# Patient Record
Sex: Male | Born: 1975 | Race: White | Hispanic: No | Marital: Married | Smoking: Former smoker
Health system: Southern US, Community
[De-identification: ages and names within clinical notes are randomized; demographics above are authoritative.]

## PROBLEM LIST (undated history)

## (undated) DIAGNOSIS — G8929 Other chronic pain: Secondary | ICD-10-CM

## (undated) DIAGNOSIS — M7918 Myalgia, other site: Secondary | ICD-10-CM

## (undated) DIAGNOSIS — T4145XA Adverse effect of unspecified anesthetic, initial encounter: Secondary | ICD-10-CM

## (undated) DIAGNOSIS — M1990 Unspecified osteoarthritis, unspecified site: Secondary | ICD-10-CM

## (undated) DIAGNOSIS — Z9889 Other specified postprocedural states: Secondary | ICD-10-CM

## (undated) DIAGNOSIS — M549 Dorsalgia, unspecified: Secondary | ICD-10-CM

## (undated) DIAGNOSIS — R112 Nausea with vomiting, unspecified: Secondary | ICD-10-CM

## (undated) DIAGNOSIS — F419 Anxiety disorder, unspecified: Secondary | ICD-10-CM

## (undated) DIAGNOSIS — T8859XA Other complications of anesthesia, initial encounter: Secondary | ICD-10-CM

## (undated) DIAGNOSIS — I10 Essential (primary) hypertension: Secondary | ICD-10-CM

## (undated) HISTORY — DX: Dorsalgia, unspecified: M54.9

## (undated) HISTORY — DX: Other chronic pain: G89.29

## (undated) HISTORY — PX: MICRODISCECTOMY LUMBAR: SUR864

## (undated) HISTORY — DX: Myalgia, other site: M79.18

## (undated) HISTORY — PX: PILONIDAL CYST EXCISION: SHX744

## (undated) HISTORY — DX: Essential (primary) hypertension: I10

---

## 2010-01-21 ENCOUNTER — Encounter
Admission: RE | Admit: 2010-01-21 | Discharge: 2010-02-02 | Payer: Self-pay | Source: Home / Self Care | Attending: Physical Medicine & Rehabilitation | Admitting: Physical Medicine & Rehabilitation

## 2010-01-26 ENCOUNTER — Ambulatory Visit
Admission: RE | Admit: 2010-01-26 | Discharge: 2010-01-26 | Payer: Self-pay | Source: Home / Self Care | Attending: Physical Medicine & Rehabilitation | Admitting: Physical Medicine & Rehabilitation

## 2010-02-11 ENCOUNTER — Ambulatory Visit: Payer: Worker's Compensation | Admitting: Physical Medicine & Rehabilitation

## 2010-02-11 ENCOUNTER — Encounter: Payer: Worker's Compensation | Attending: Physical Medicine & Rehabilitation

## 2010-02-11 DIAGNOSIS — M5126 Other intervertebral disc displacement, lumbar region: Secondary | ICD-10-CM | POA: Insufficient documentation

## 2010-02-11 DIAGNOSIS — M5380 Other specified dorsopathies, site unspecified: Secondary | ICD-10-CM | POA: Insufficient documentation

## 2010-02-11 DIAGNOSIS — M5137 Other intervertebral disc degeneration, lumbosacral region: Secondary | ICD-10-CM

## 2010-02-11 DIAGNOSIS — M51379 Other intervertebral disc degeneration, lumbosacral region without mention of lumbar back pain or lower extremity pain: Secondary | ICD-10-CM

## 2010-02-11 DIAGNOSIS — M79609 Pain in unspecified limb: Secondary | ICD-10-CM | POA: Insufficient documentation

## 2010-02-11 DIAGNOSIS — M545 Low back pain, unspecified: Secondary | ICD-10-CM | POA: Insufficient documentation

## 2010-02-24 NOTE — Consult Note (Signed)
Reviewed records from [facility].  Reviewed records from Dr. Candise Bowens office as well as Dr. Danae Orleans. Stern's office.  Also reviewed functional capacity eval supplied by medical case manager.  CHIEF COMPLAINT:  Back pain and buttock pain.  HISTORY:  A 35 year old male who was using a sledgehammer and taking up rebar at work on May 19, 2009, when he noted sudden onset of low back pain.  This progressed over the course of days.  He sought medical attention, was seen initially at [facility], placed on naproxen and Flexeril.  He was trialed on Nucynta after he did not tolerate hydrocodone from a cognitive standpoint.  He was started on physical therapy.  He was seen by Dr. Madelon Lips from Orthopedics in June 2011.  Felt to have a radiculopathy.  Sent for MRI demonstrating annular tear, shallow disk protrusion, left L4-5 protrusion causing potential left L5 nerve root impingement.  He then was referred to Dr. Danae Orleans. Venetia Maxon, who performed a left L5 selective nerve root block on August 07, 2009, which made him "10%" better.  He was started on work conditioning, but he had an exacerbation of his pain.  An MRI September 19, 2009, showed regression of the L3-4 protrusion and stable protrusion at L4-5, mainly leftward.  He underwent an FCE on December 10, 2009 and was okay for medium heavy demand level.  He followed up with Neurosurgery and was used to work with his restrictions.  He states that his work activities have not exacerbated his pain as much as physical therapy.  I went over what they did in therapy, it was quite a bit of squatting.  The patient's current pain level is 6/10, does respond to Nucynta.  He takes a half a tablet of the 75 mg dosage up to several times a day. His pain is characterized as intermittent and stabbing including the low back and buttock area.  Pain does interfere in a mild level with his activities.  His pain is worse during evening and  nighttime hours.  Pain is worse with walking and bending, improves with rest and medication. Relief from medications is good.  He can walk 40 minutes at time.  He climbs steps.  He drives.  He works 40 hours a week.  He is in his third week of employment once again.  He has some spasm in low back area.  PAST HISTORY:  Significant for hypertension, on Benicar.  SOCIAL HISTORY:  Married, lives with wife and 3 children.  CURRENT EXERCISE PROGRAM:  Treadmill training as well as a yoga.  ALLERGIES:  CODEINE causing hives.  Opioid risk tool score of 1 based on age.  PHYSICAL EXAMINATION:  VITAL SIGNS:  Blood pressure 118/69, pulse 75, respirations 18, and O2 saturation 97% on room air. GENERAL:  A well-developed, well-nourished male in no acute distress. MUSCULOSKELETAL:  Neck full range of motion.  Upper extremity full range of motion and strength.  Normal upper extremity reflexes.  Normal upper extremity sensation. Lower extremities have negative straight leg raise test.  Negative Faber maneuver.  He has normal sensation in bilateral lower extremities.  On the right lower extremity and left side, he has decreased L4, L5, and S1 dermatomal sensory testing.  His deep tendon reflexes are normal. Strength is normal bilateral lower extremities.  Gait is normal.  He is able to toe walk and heel walk.  His lumbar spine range of motion is 50% range in forward flexion and extension.  He has more pain with forward flexion and then with extension.  IMPRESSION:  Lumbar disk, most likely symptomatic at the L4-5 level.  He does have some residual sensory deficit in left lower extremity from his radiculopathy.  1. Overall, he has done quite well with Nucynta and taking only half a     tablet at a time several times a day.  Therefore, I think it is     reasonable to trial him on tramadol so that he does not have to get     written prescriptions and be monitored for C2 substance.  He is     open to  this suggestion.  I have written him some tramadol 50 mg to     take 1-2 tablets up to 4 times per day. 2. I do not think he needs any type of pain injections at the current     time. 3. I do not think he needs any further physical therapy at this time. 4. We will follow him over time.  If he does not respond well to the     tramadol, we will resume the Nucynta. 5. Recommend continuing treadmill training as well as yoga that he is     doing at home. 6. I will see him back in about 3 weeks monitor how his return to work     is doing.  He may need an MRI in May which would be about 1 year     post see if there is any further absorption of the L4-5 disk.     [name], M.D.    AEK/MedQ D:01/26/2010 17:38:01  T:01/27/2010 03:32:01  Job #:  [id number]  Electronically Signed by Claudette Laws M.D. on 02/24/2010 09:54:18 AM Reviewed records from [facility].  Reviewed records from Dr. Candise Bowens office as well as Dr. Danae Orleans. Stern's office.  Also reviewed functional capacity eval supplied by medical case manager.  CHIEF COMPLAINT:  Back pain and buttock pain.  HISTORY:  A 35 year old male who was using a sledgehammer and taking up rebar at work on May 19, 2009, when he noted sudden onset of low back pain.  This progressed over the course of days.  He sought medical attention, was seen initially at [facility], placed on naproxen and Flexeril.  He was trialed on Nucynta after he did not tolerate hydrocodone from a cognitive standpoint.  He was started on physical therapy.  He was seen by Dr. Madelon Lips from Orthopedics in June 2011.  Felt to have a radiculopathy.  Sent for MRI demonstrating annular tear, shallow disk protrusion, left L4-5 protrusion causing potential left L5 nerve root impingement.  He then was referred to Dr. Danae Orleans. Venetia Maxon, who performed a left L5 selective nerve root block on August 07, 2009, which made him "10%" better.  He was started  on work conditioning, but he had an exacerbation of his pain.  An MRI September 19, 2009, showed regression of the L3-4 protrusion and stable protrusion at L4-5, mainly leftward.  He underwent an FCE on December 10, 2009 and was okay for medium heavy demand level.  He followed up with Neurosurgery and was used to work with his restrictions.  He states that his work activities have not exacerbated his pain as much as physical therapy.  I went over what they did in therapy, it was quite a bit of squatting.  The patient's current pain level is 6/10, does respond to Nucynta.  He  takes a half a tablet of the 75 mg dosage up to several times a day. His pain is characterized as intermittent and stabbing including the low back and buttock area.  Pain does interfere in a mild level with his activities.  His pain is worse during evening and nighttime hours.  Pain is worse with walking and bending, improves with rest and medication. Relief from medications is good.  He can walk 40 minutes at time.  He climbs steps.  He drives.  He works 40 hours a week.  He is in his third week of employment once again.  He has some spasm in low back area.  PAST HISTORY:  Significant for hypertension, on Benicar.  SOCIAL HISTORY:  Married, lives with wife and 3 children.  CURRENT EXERCISE PROGRAM:  Treadmill training as well as a yoga.  ALLERGIES:  CODEINE causing hives.  Opioid risk tool score of 1 based on age.  PHYSICAL EXAMINATION:  VITAL SIGNS:  Blood pressure 118/69, pulse 75, respirations 18, and O2 saturation 97% on room air. GENERAL:  A well-developed, well-nourished male in no acute distress. MUSCULOSKELETAL:  Neck full range of motion.  Upper extremity full range of motion and strength.  Normal upper extremity reflexes.  Normal upper extremity sensation. Lower extremities have negative straight leg raise test.  Negative Faber maneuver.  He has normal sensation in bilateral lower extremities.   On the right lower extremity and left side, he has decreased L4, L5, and S1 dermatomal sensory testing.  His deep tendon reflexes are normal. Strength is normal bilateral lower extremities.  Gait is normal.  He is able to toe walk and heel walk.  His lumbar spine range of motion is 50% range in forward flexion and extension.  He has more pain with forward flexion and then with extension.  IMPRESSION:  Lumbar disk, most likely symptomatic at the L4-5 level.  He does have some residual sensory deficit in left lower extremity from his radiculopathy.  1. Overall, he has done quite well with Nucynta and taking only half a     tablet at a time several times a day.  Therefore, I think it is     reasonable to trial him on tramadol so that he does not have to get     written prescriptions and be monitored for C2 substance.  He is     open to this suggestion.  I have written him some tramadol 50 mg to     take 1-2 tablets up to 4 times per day. 2. I do not think he needs any type of pain injections at the current     time. 3. I do not think he needs any further physical therapy at this time. 4. We will follow him over time.  If he does not respond well to the     tramadol, we will resume the Nucynta. 5. Recommend continuing treadmill training as well as yoga that he is     doing at home. 6. I will see him back in about 3 weeks monitor how his return to work     is doing.  He may need an MRI in May which would be about 1 year     post see if there is any further absorption of the L4-5 disk.     [name], M.D. Electronically Signed    AEK/MedQ D:01/26/2010 17:38:01  T:01/27/2010 03:32:01  Job #:  [id number]

## 2010-03-25 ENCOUNTER — Encounter: Payer: Worker's Compensation | Attending: Physical Medicine & Rehabilitation

## 2010-03-25 ENCOUNTER — Ambulatory Visit: Payer: Worker's Compensation | Admitting: Physical Medicine & Rehabilitation

## 2010-03-25 DIAGNOSIS — M545 Low back pain, unspecified: Secondary | ICD-10-CM | POA: Insufficient documentation

## 2010-03-25 DIAGNOSIS — M5126 Other intervertebral disc displacement, lumbar region: Secondary | ICD-10-CM | POA: Insufficient documentation

## 2010-03-25 DIAGNOSIS — M79609 Pain in unspecified limb: Secondary | ICD-10-CM | POA: Insufficient documentation

## 2010-03-25 DIAGNOSIS — M5380 Other specified dorsopathies, site unspecified: Secondary | ICD-10-CM | POA: Insufficient documentation

## 2010-03-25 DIAGNOSIS — M5137 Other intervertebral disc degeneration, lumbosacral region: Secondary | ICD-10-CM

## 2010-04-30 ENCOUNTER — Encounter: Payer: Worker's Compensation | Attending: Physical Medicine & Rehabilitation

## 2010-04-30 ENCOUNTER — Ambulatory Visit: Payer: Worker's Compensation | Admitting: Physical Medicine & Rehabilitation

## 2010-04-30 DIAGNOSIS — M5126 Other intervertebral disc displacement, lumbar region: Secondary | ICD-10-CM | POA: Insufficient documentation

## 2010-04-30 DIAGNOSIS — M5137 Other intervertebral disc degeneration, lumbosacral region: Secondary | ICD-10-CM

## 2010-04-30 DIAGNOSIS — M545 Low back pain, unspecified: Secondary | ICD-10-CM | POA: Insufficient documentation

## 2010-04-30 DIAGNOSIS — G8929 Other chronic pain: Secondary | ICD-10-CM | POA: Insufficient documentation

## 2010-04-30 DIAGNOSIS — M51379 Other intervertebral disc degeneration, lumbosacral region without mention of lumbar back pain or lower extremity pain: Secondary | ICD-10-CM | POA: Insufficient documentation

## 2010-04-30 NOTE — Assessment & Plan Note (Signed)
REASON FOR VISIT:  History of L3-4, L4-5 disk protrusions with chronic low back pain.  [name] returns today.  He is a 35 year old male with chief complaint of back pain.  He has been doing relatively well on his medication until about a week ago when he was up on a scaffold more.  He is working full-time 40 hours a week, medium heavy duty.  Max lifting 65 pounds occasional.  He has no radicular symptoms in the lower extremities.  He is using his Nucynta one half tablet about 3 times a day at this point.  His last prescription was written on April 06, 2010. He still has 25/45 left.  He is using the Celebrex 1 tablet per day.  He sometimes feels that on a bad day.  He would like to take another one. He has been on b.i.d. in the past.  He has had no other new medical problems in the interval time.  REVIEW OF SYSTEMS:  Positive for irritability.  He states that he snaps at his family a lot.  He is having some problems with the adjustment to his chronic pain.  PHYSICAL EXAMINATION:  VITAL SIGNS:  Blood pressure 119/74, pulse 79, respirations 18 and O2 sat 98% on room air. GENERAL:  Well-developed, well-nourished male, in no acute distress. Orientation x3.  Affect is alert.  Gait is normal.  EXTREMITIES: Without edema.  Mood and affect are appropriate.  He has tenderness to palpation of the lumbar paraspinal.  He has negative straight leg raise, normal strength in lower extremity and normal gait.  IMPRESSION:  Lumbar degenerative disk.  No signs of radiculopathy.  PLAN: 1. We will continue Nucynta 75 one half p.o. b.i.d. or t.i.d. 2. Celebrex 200 mg a day.  He can take an extra tablet for bad days.  I will see him back in 1 month.  Discussed a long discussion. Psychology will see the patient as well Dr. Frutoso Chase referral.     [name], M.D. Electronically Signed    AEK/MedQ D:  04/30/2010 13:25:01  T:  04/30/2010 23:16:22  Job #:  [id number]  cc:   Danae Orleans. Venetia Maxon,  M.D. Fax: [phone]

## 2010-05-27 ENCOUNTER — Ambulatory Visit: Payer: Worker's Compensation | Admitting: Physical Medicine & Rehabilitation

## 2010-05-27 ENCOUNTER — Encounter: Payer: Worker's Compensation | Attending: Physical Medicine & Rehabilitation

## 2010-05-27 DIAGNOSIS — M5126 Other intervertebral disc displacement, lumbar region: Secondary | ICD-10-CM | POA: Insufficient documentation

## 2010-05-27 DIAGNOSIS — M79609 Pain in unspecified limb: Secondary | ICD-10-CM | POA: Insufficient documentation

## 2010-05-27 DIAGNOSIS — M51379 Other intervertebral disc degeneration, lumbosacral region without mention of lumbar back pain or lower extremity pain: Secondary | ICD-10-CM

## 2010-05-27 DIAGNOSIS — M5380 Other specified dorsopathies, site unspecified: Secondary | ICD-10-CM | POA: Insufficient documentation

## 2010-05-27 DIAGNOSIS — M545 Low back pain, unspecified: Secondary | ICD-10-CM | POA: Insufficient documentation

## 2010-05-27 DIAGNOSIS — M5137 Other intervertebral disc degeneration, lumbosacral region: Secondary | ICD-10-CM

## 2010-05-28 NOTE — Assessment & Plan Note (Signed)
REASON FOR VISIT:  Back pain.  CHIEF COMPLAINT:  A 35 year old male with chief complaint of back pain doing relatively well on his medications.  He continues working 40 hours a week, medium heavy duty 65 occasional lifting, no radicular symptoms. He has had some anterior thigh pain bilaterally.  He takes his Nucynta 1/2 tablet about 3 times per day, 75 mg dose.  No signs of aberrant drug behavior.  He takes Celebrex one a day, but sometimes twice.  We did caution that he will need to have kidney function tests twice a year which he states that it can be done in his primary care physician's office.  No new medical problems.  PHYSICAL EXAMINATION:  GENERAL:  No acute distress.  Mood and affect appropriate. MUSCULOSKELETAL:  Gait is normal.  He is able to squat.  Negative straight leg raise.  Negative femoral stretch test.  Decreased sensation left L3-L4, L5-S1, dermatomal distribution.  Deep tendon reflexes are normal.  IMPRESSION:  Lumbar degenerative disk.  No signs of radiculopathy.  His sensory complaints are not changed since January.  PLAN:  We will continue his Nucynta 75 one-half p.o. b.i.d. or t.i.d., Celebrex 200 mg a day.  I will see him back in 2 months, nursing visit in 1 month.  As I discussed with patient, I think his primary physician could prescribe these medications if he is comfortable.  Meeting maximal medical improvement from a pain management standpoint.  Case manager, Waynetta Pean came in.  We discussed the above findings. She had a couple of other questions that we answered.     [name], M.D. Electronically Signed    AEK/MedQ D:  05/27/2010 14:40:07  T:  05/28/2010 01:06:12  Job #:  [id number]  cc:   Dr. Georgia Duff  [phone] [facility]

## 2010-06-23 ENCOUNTER — Ambulatory Visit: Payer: Self-pay

## 2010-06-23 ENCOUNTER — Encounter: Payer: Worker's Compensation | Attending: Physical Medicine & Rehabilitation

## 2010-06-23 DIAGNOSIS — M5137 Other intervertebral disc degeneration, lumbosacral region: Secondary | ICD-10-CM | POA: Insufficient documentation

## 2010-06-23 DIAGNOSIS — M51379 Other intervertebral disc degeneration, lumbosacral region without mention of lumbar back pain or lower extremity pain: Secondary | ICD-10-CM | POA: Insufficient documentation

## 2010-06-23 DIAGNOSIS — M549 Dorsalgia, unspecified: Secondary | ICD-10-CM | POA: Insufficient documentation

## 2010-07-26 ENCOUNTER — Ambulatory Visit: Payer: Worker's Compensation | Admitting: Physical Medicine & Rehabilitation

## 2010-07-26 ENCOUNTER — Encounter: Payer: Worker's Compensation | Attending: Physical Medicine & Rehabilitation

## 2010-07-26 DIAGNOSIS — G8929 Other chronic pain: Secondary | ICD-10-CM | POA: Insufficient documentation

## 2010-07-26 DIAGNOSIS — M5137 Other intervertebral disc degeneration, lumbosacral region: Secondary | ICD-10-CM

## 2010-07-26 DIAGNOSIS — M545 Low back pain, unspecified: Secondary | ICD-10-CM | POA: Insufficient documentation

## 2010-07-26 DIAGNOSIS — M5126 Other intervertebral disc displacement, lumbar region: Secondary | ICD-10-CM | POA: Insufficient documentation

## 2010-07-26 DIAGNOSIS — M51379 Other intervertebral disc degeneration, lumbosacral region without mention of lumbar back pain or lower extremity pain: Secondary | ICD-10-CM | POA: Insufficient documentation

## 2010-07-27 NOTE — Assessment & Plan Note (Signed)
REASON FOR VISIT:  Back pain and lower extremity pain.  A 35 year old male with chronic back pain, lumbar herniated nucleus pulposus with left lower extremity radicular discomfort.  He is working 40 hours a week, medium heavy duty, 65-pound occasional lifting, no radicular symptoms.  Last visit May 27, 2010.  He did run out of his Celebrex for couple of days.  He noticed a big difference off the Celebrex.  He in the past has tried higher doses of Nucynta i.e., 50 mg t.i.d. which causes excessive sedation and 37.5 t.i.d. seems to be a good balance for him.  PHYSICAL EXAMINATION:  GENERAL:  No acute distress. NEUROLOGIC:  Mood and affect appropriate.  Gait is normal.  Able to squat.  Negative straight leg raising test.  Deep tendon reflexes are normal in bilateral lower extremities.  IMPRESSION:  Lumbar degenerative disk, has complaints of radicular-type symptomatology, but no real physical findings that looked like a new acute disk herniation.  PLAN:  We will continue his Nucynta 75 mg one-half p.o. t.i.d. as well as Celebrex 200 mg a day on a more p.r.n. basis.  We will prescribe some Flexeril 5 mg t.i.d. p.r.n.  He is not going to be using this every day.  He has met maximal medical improvement from pain management standpoint. I will have him see the nurse in next 2 months and see me on third month and at some point we can transition to the [facility].     [name], M.D. Electronically Signed    AEK/MedQ D:  07/26/2010 14:38:25  T:  07/26/2010 23:58:01  Job #:  [id number]  cc:   Mollie Germany Workers [employer] Adjuster, [address] [place], [place] [address]  Danae Orleans. Venetia Maxon, M.D. Fax: [phone]

## 2010-07-30 ENCOUNTER — Ambulatory Visit: Payer: Self-pay | Admitting: Physical Medicine & Rehabilitation

## 2010-08-27 ENCOUNTER — Encounter: Payer: Worker's Compensation | Attending: Neurosurgery | Admitting: Neurosurgery

## 2010-08-27 DIAGNOSIS — M5430 Sciatica, unspecified side: Secondary | ICD-10-CM

## 2010-08-27 DIAGNOSIS — M5137 Other intervertebral disc degeneration, lumbosacral region: Secondary | ICD-10-CM | POA: Insufficient documentation

## 2010-08-27 DIAGNOSIS — M545 Low back pain, unspecified: Secondary | ICD-10-CM | POA: Insufficient documentation

## 2010-08-27 DIAGNOSIS — M51379 Other intervertebral disc degeneration, lumbosacral region without mention of lumbar back pain or lower extremity pain: Secondary | ICD-10-CM | POA: Insufficient documentation

## 2010-08-27 NOTE — Assessment & Plan Note (Signed)
Account [id number].  This is a patient who is seen by Dr. Wynn Banker for back pain.  He is working still.  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  He states that he has a constant low back pain that radiates into his buttocks bilaterally.  He rates his pain at 6 to 7.  It is a sharp stabbing pain that is constant.  Pain is worse in the morning and evening.  Sleep patterns are fair.  Pain is worse with walking, standing, and most activity.  Rest and medication tend to help, and he walks without assistance.  He can walk up to an hour at a time and climb steps and drive.  REVIEW OF SYSTEMS:  Notable for those difficulties, otherwise within normal limits.  No suicidal thoughts or aberrant behaviors.  Oswestry score is 32.  PAST MEDICAL HISTORY:  Unchanged.  SOCIAL HISTORY:  He is married, lives with his wife.  FAMILY HISTORY:  Unchanged.  PHYSICAL EXAMINATION:  VITAL SIGNS:  His blood pressure 130/78, pulse 85, respirations 16, O2 sats 98 on room air.  His motor strength is 5/5 in lower extremities.  Sensation is intact. Constitutionally, he is within normal limits.  He is alert and oriented x3, has a slight limp.  ASSESSMENT:  Degenerative disk disease.  Radiation of pain into the buttocks no lower extremity radiculopathy.  PLAN:  Continue Nucynta 1/2 to one up to b.i.d. 45 with no refill and continue Celebrex, Flexeril.  He has refills on those.  We will follow him up here in 1 month.  His questions were encouraged and answered. The patient does not want to pursue any kind of surgical intervention at this time.     Eric Casey Electronically Signed    RLW/MedQ D:  08/27/2010 13:34:04  T:  08/27/2010 21:37:10  Job #:  [id number]

## 2010-09-28 ENCOUNTER — Ambulatory Visit (HOSPITAL_COMMUNITY)
Admission: RE | Admit: 2010-09-28 | Discharge: 2010-09-28 | Disposition: A | Discharge status: Home / Self Care | Payer: Worker's Compensation | Source: Ambulatory Visit | Attending: Physical Medicine & Rehabilitation | Admitting: Physical Medicine & Rehabilitation

## 2010-09-28 ENCOUNTER — Other Ambulatory Visit: Payer: Self-pay | Admitting: Physical Medicine & Rehabilitation

## 2010-09-28 ENCOUNTER — Encounter: Payer: Worker's Compensation | Attending: Neurosurgery | Admitting: Neurosurgery

## 2010-09-28 DIAGNOSIS — M549 Dorsalgia, unspecified: Secondary | ICD-10-CM

## 2010-09-28 DIAGNOSIS — M545 Low back pain, unspecified: Secondary | ICD-10-CM | POA: Insufficient documentation

## 2010-09-28 DIAGNOSIS — M79609 Pain in unspecified limb: Secondary | ICD-10-CM | POA: Insufficient documentation

## 2010-09-28 DIAGNOSIS — G894 Chronic pain syndrome: Secondary | ICD-10-CM

## 2010-09-28 DIAGNOSIS — M5137 Other intervertebral disc degeneration, lumbosacral region: Secondary | ICD-10-CM | POA: Insufficient documentation

## 2010-09-28 DIAGNOSIS — I10 Essential (primary) hypertension: Secondary | ICD-10-CM | POA: Insufficient documentation

## 2010-09-28 DIAGNOSIS — M51379 Other intervertebral disc degeneration, lumbosacral region without mention of lumbar back pain or lower extremity pain: Secondary | ICD-10-CM | POA: Insufficient documentation

## 2010-09-28 NOTE — Assessment & Plan Note (Signed)
This is a patient of Dr. Wynn Banker who is seen for low back pain and left thigh radicular pain.  The patient states the pain is getting worse.  It is point constant.  His wife is with him today who states that he is becoming more irritable around the house.  He feels like that something is definitely wrong in his back.  He does not want to stay on pain medicine forever.  He rates his average pain now at an 8.  It is a sharp, burning-type pain.  General activity level is 7-8.  Pain is worse in morning and night.  Bending and sitting tend to aggravate with activity.  Rest, heat, and medication tend to help.  He walks without assistance.  He can climb steps and drive.  He still tries to work 40 hours a week in his car.  REVIEW OF SYSTEMS:  Notable for difficulties described above, otherwise within normal limits.  PAST MEDICAL HISTORY:  Significant for hypertension.  SOCIAL HISTORY:  Married.  FAMILY HISTORY:  Unchanged.  PHYSICAL EXAMINATION:  His blood pressure is 115/74, pulse 75, respirations 16, and O2 sats 96 on room air.  His motor strength is 5/5 in lower extremities with iliopsoas and quadriceps to confrontational testing.  Sensation is intact in the right lower extremity.  Left lower extremity shows some diminished sensation in the dorsum that has worsened.  He has pain with flexion and extension and is reportedly having more trouble with his gait.  He does walk somewhat with a kyphotic stance today.  ASSESSMENT:  Degenerative disk disease, chronic low back pain worsening.  PLAN:  Continue refill on Nucynta 75 mg 1 to 1-1/2 p.o. b.i.d., #45 with no refill.  He is taking Celebrex, he will continue that, but he is this going to stop for short period.  Try Sterapred 10 mg 6-day dose pack. He is instructed to stop Celebrex as of today and start the dose pack tomorrow and start the Celebrex back the day after he finishes dose pack.  We are also going to get an MRI of the lumbar  spine without gadolinium as well as a full lumbar spine series radiographs with flexion/extension views.  We will see him back in a month to go over this.  He states if things are worse he wants [place] to call him when the MRI report comes and then refer him back to Dr. Maeola Harman at [facility].  His questions were otherwise encouraged and answered.     Tawana Pasch L. Blima Dessert Electronically Signed    RLW/MedQ D:  09/28/2010 10:30:57  T:  09/28/2010 11:00:54  Job #:  [id number]

## 2010-11-01 ENCOUNTER — Encounter: Payer: Worker's Compensation | Attending: Neurosurgery

## 2010-11-01 ENCOUNTER — Ambulatory Visit: Payer: Worker's Compensation | Admitting: Physical Medicine & Rehabilitation

## 2010-11-01 DIAGNOSIS — M5137 Other intervertebral disc degeneration, lumbosacral region: Secondary | ICD-10-CM

## 2010-11-01 DIAGNOSIS — M545 Low back pain, unspecified: Secondary | ICD-10-CM | POA: Insufficient documentation

## 2010-11-01 DIAGNOSIS — I10 Essential (primary) hypertension: Secondary | ICD-10-CM | POA: Insufficient documentation

## 2010-11-01 DIAGNOSIS — M47817 Spondylosis without myelopathy or radiculopathy, lumbosacral region: Secondary | ICD-10-CM

## 2010-11-01 DIAGNOSIS — M51379 Other intervertebral disc degeneration, lumbosacral region without mention of lumbar back pain or lower extremity pain: Secondary | ICD-10-CM | POA: Insufficient documentation

## 2010-11-01 DIAGNOSIS — IMO0002 Reserved for concepts with insufficient information to code with codable children: Secondary | ICD-10-CM

## 2010-11-01 NOTE — Assessment & Plan Note (Signed)
REASON FOR VISIT:  Low back pain, left lower extremity pain.  HISTORY:  A 34 year old male involved in a workers comp injury.  Date of injury on May 14, 2009.  He has low back pain, left-sided radicular pain.  At last visit on September 28, 2010, complained of worsening of pain, seen by my nurse practitioner, Kallie Edward.  He tried a Sterapred dose pack.  In addition, MRI of the lumbar spine was ordered. This demonstrated no significant change compared to September 29, 2009, MRI.  Annular tear and central protrusion foramina and central canal patent at L3-4, at L4-5 some facet degenerative disease canal and right foramen open, mild left foraminal narrowing, unchanged.  Examination demonstrates mild reduction of sensation to pinprick, left L4 dermatomal distribution, otherwise intact.  Deep tendon reflexes are 1+ left ankle, 2+ right ankle, 2+ bilateral knees.  Lumbar range of motion is 50% forward flexion, extension, lateral rotation, and bending. He has pain both with forward as well as backward bending.  IMPRESSION:  Lumbar degenerative disk with intermittent radicular symptoms related to mild left L4-5 foraminal narrowing likely has some aggravation during certain movements.  He does continue to work 40 hours a week.  He is on low-dose narcotic analgesics, maintaining function, no signs of abuse although he did forget his medications today, cautioned that he will need to go back for his meds if he forgets them again.  He will see the nurse practitioner in 2 months and I will see him in 3 months.     [name], M.D. Electronically Signed    AEK/MedQ D:  11/01/2010 10:42:46  T:  11/01/2010 11:28:50  Job #:  [id number]  cc:   Danae Orleans. Venetia Maxon, M.D. Fax: [phone]  [employer] [phone]

## 2010-12-03 ENCOUNTER — Encounter: Payer: Worker's Compensation | Attending: Neurosurgery | Admitting: Neurosurgery

## 2010-12-03 DIAGNOSIS — M5137 Other intervertebral disc degeneration, lumbosacral region: Secondary | ICD-10-CM

## 2010-12-03 DIAGNOSIS — M545 Low back pain, unspecified: Secondary | ICD-10-CM | POA: Insufficient documentation

## 2010-12-03 DIAGNOSIS — M51379 Other intervertebral disc degeneration, lumbosacral region without mention of lumbar back pain or lower extremity pain: Secondary | ICD-10-CM | POA: Insufficient documentation

## 2010-12-04 NOTE — Assessment & Plan Note (Signed)
This is a patient of Dr. Wynn Banker seen for low back pain.  He did review his MRI, which demonstrated no significant changes that has some mild central canal stenosis at the L3-4 and 4-5.  The patient talked to Dr. Wynn Banker about possible ESIs __________ see him  next month. Average pain is 6-7, it is sharp, burning, aching pain.  General activity level is 5.  Pain is worse with bending and standing.  Rest, heat, and medication seem to help.  He walks with without assistance. He climbs steps and driving.  He walks about an hour at a time.  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  REVIEW OF SYSTEMS:  Notable for difficulties described above, otherwise within normal limits.  PAST MEDICAL HISTORY, SOCIAL HISTORY, AND FAMILY HISTORY:  Unchanged.  PHYSICAL EXAMINATION:  VITAL SIGNS:  His blood pressure is 116/79, pulse 74, respirations 16, and O2 sats 97 on room air. MUSCULOSKELETAL:  His motor strength and sensation intact. GENERAL:  Constitutionally within normal limits.  He is alert and oriented x3.  He has normal gait.  IMPRESSION:  Lumbar degenerative disk disease with radiculopathy.  PLAN:  Refill Nucynta 75 mg 1-1/2 to 1 p.o. b.i.d., 45 with no refill. His questions were encouraged and answered.  He will see Dr. Wynn Banker in a month to discuss possible ESIs.     Eric Casey L. Blima Dessert Electronically Signed    RLW/MedQ D:  12/03/2010 13:02:43  T:  12/03/2010 14:18:06  Job #:  [id number]

## 2010-12-31 ENCOUNTER — Ambulatory Visit: Payer: Self-pay | Admitting: Physical Medicine & Rehabilitation

## 2011-01-07 ENCOUNTER — Ambulatory Visit: Payer: Self-pay | Admitting: Neurosurgery

## 2011-01-14 ENCOUNTER — Encounter: Payer: Worker's Compensation | Attending: Neurosurgery

## 2011-01-14 ENCOUNTER — Ambulatory Visit: Payer: Worker's Compensation | Admitting: Physical Medicine & Rehabilitation

## 2011-01-14 DIAGNOSIS — I10 Essential (primary) hypertension: Secondary | ICD-10-CM | POA: Insufficient documentation

## 2011-01-14 DIAGNOSIS — IMO0002 Reserved for concepts with insufficient information to code with codable children: Secondary | ICD-10-CM

## 2011-01-14 DIAGNOSIS — M5137 Other intervertebral disc degeneration, lumbosacral region: Secondary | ICD-10-CM | POA: Insufficient documentation

## 2011-01-14 DIAGNOSIS — M545 Low back pain, unspecified: Secondary | ICD-10-CM | POA: Insufficient documentation

## 2011-01-14 DIAGNOSIS — M51379 Other intervertebral disc degeneration, lumbosacral region without mention of lumbar back pain or lower extremity pain: Secondary | ICD-10-CM | POA: Insufficient documentation

## 2011-01-15 NOTE — Assessment & Plan Note (Signed)
Eric Casey is a 36 year old male with lumbar degenerative disk.  He has had chronic low back pain in a work-related injury, but has been able to maintain work status on Nucynta low dose.  He is working 40 hours a week.  His Oswestry scores have been reviewed over the last year, ranging from 32 to 34 putting him in the moderate range.  The numeric rating scale is 7.  Walking tolerance is 60 minutes.  He can climb steps.  He can drive.  PHYSICAL EXAMINATION:  MUSCULOSKELETAL:  Straight leg raising test is negative.  Lower extremity strength is normal.  Deep tendon reflexes are reduced in left ankle.  Back has no tenderness to palpation of lumbar paraspinals.  He has limited range of motion about 50% forward flexion, extension, lateral rotation, and bending.  Lateral bending toward the right is more painful than to the left.  IMPRESSION:  Lumbar disk disease.  He does have chronic sciatica, some evidence of chronic S1 radiculopathy.  PLAN:  We will continue current medications.  He has not shown any signs of aberrant drug behavior.  He will continue Nucynta 75 mg one to one and half tablets b.i.d., 45 for a month.  I also asked for a replacement ice pack.  He has some type of freezable ice pack that he received from physical therapy that is now wearing out.  He uses it on daily basis and gets great relief with it.  I will see him back in about 1 month's time.     Eric Casey, M.D. Electronically Signed    AEK/MedQ D:  01/14/2011 10:57:45  T:  01/15/2011 02:34:13  Job #:  [id number]  cc:   [facility]

## 2011-02-11 ENCOUNTER — Ambulatory Visit: Payer: Worker's Compensation | Admitting: Physical Medicine & Rehabilitation

## 2011-02-11 ENCOUNTER — Encounter: Payer: Worker's Compensation | Attending: Neurosurgery

## 2011-02-11 DIAGNOSIS — M5137 Other intervertebral disc degeneration, lumbosacral region: Secondary | ICD-10-CM

## 2011-02-11 DIAGNOSIS — M545 Low back pain, unspecified: Secondary | ICD-10-CM | POA: Insufficient documentation

## 2011-02-11 DIAGNOSIS — M51379 Other intervertebral disc degeneration, lumbosacral region without mention of lumbar back pain or lower extremity pain: Secondary | ICD-10-CM | POA: Insufficient documentation

## 2011-02-11 DIAGNOSIS — I10 Essential (primary) hypertension: Secondary | ICD-10-CM | POA: Insufficient documentation

## 2011-02-15 NOTE — Assessment & Plan Note (Signed)
REASON FOR VISIT:  Back pain, degenerative disk.  Date of last visit, January 14, 2011.  A 36 year old male with lumbar degenerative disk, chronic low back pain, work-related injury, able to maintain work status on low-dose Nucynta, working 40 hours a week in [employer].  Oswestry scores have been stable over the year 34%, which is in his normal range.  Numeric pain rating score is 7 which is in his normal range.  Walking tolerance is 60 minutes which is his usual report.  He is able climb steps.  He is able to drive.  PHYSICAL EXAMINATION:  GENERAL:  No acute distress.  Mood and affect appropriate. MUSCULOSKELETAL:  Lower extremity strength is normal.  Back has no tenderness to palpation.  Limited range of motion 50% forward flexion, extension, lateral rotation, and bending.  Ambulation without toe drag or knee instability. NEUROLOGIC:  Mood and affect are appropriate.  REVIEW OF SYSTEMS:  Negative for numbness, tingling, weakness in the lower extremities.  IMPRESSION:  Lumbar degenerative disk chronic sciatica.  PLAN:  Continue Nucynta 1 to 1.5 mg of the 75 mg dose, 1-1/2 tablets per day, 45 tablets per month.  Ice pack on a p.r.n. basis.  I will see him back in 1 month's time.  He may be appropriate for nurse visit.     [name], M.D. Electronically Signed    AEK/MedQ D:  02/11/2011 10:34:11  T:  02/11/2011 21:09:46  Job #:  [id number]

## 2011-03-11 ENCOUNTER — Encounter: Payer: Self-pay | Admitting: Physical Medicine & Rehabilitation

## 2011-03-11 ENCOUNTER — Telehealth: Payer: Self-pay | Admitting: Physical Medicine & Rehabilitation

## 2011-03-11 ENCOUNTER — Encounter: Payer: Worker's Compensation | Attending: Neurosurgery

## 2011-03-11 ENCOUNTER — Telehealth: Payer: Self-pay | Admitting: *Deleted

## 2011-03-11 ENCOUNTER — Ambulatory Visit (HOSPITAL_BASED_OUTPATIENT_CLINIC_OR_DEPARTMENT_OTHER): Payer: Worker's Compensation | Admitting: Physical Medicine & Rehabilitation

## 2011-03-11 DIAGNOSIS — M545 Low back pain, unspecified: Secondary | ICD-10-CM | POA: Insufficient documentation

## 2011-03-11 DIAGNOSIS — M5430 Sciatica, unspecified side: Secondary | ICD-10-CM

## 2011-03-11 DIAGNOSIS — I10 Essential (primary) hypertension: Secondary | ICD-10-CM | POA: Insufficient documentation

## 2011-03-11 DIAGNOSIS — M5137 Other intervertebral disc degeneration, lumbosacral region: Secondary | ICD-10-CM | POA: Insufficient documentation

## 2011-03-11 DIAGNOSIS — M51379 Other intervertebral disc degeneration, lumbosacral region without mention of lumbar back pain or lower extremity pain: Secondary | ICD-10-CM | POA: Insufficient documentation

## 2011-03-11 MED ORDER — TAPENTADOL HCL 50 MG PO TABS: 75.0000 mg | ORAL_TABLET | Freq: Two times a day (BID) | ORAL | Status: DC | PRN

## 2011-03-11 MED ORDER — METHYLPREDNISOLONE 4 MG PO KIT: PACK | ORAL | Status: AC

## 2011-03-11 NOTE — Telephone Encounter (Signed)
This is okay would use new since the 75 mg #45

## 2011-03-11 NOTE — Telephone Encounter (Signed)
Nucynta Rx written as "50mg , take 1 1/2 tab bid" #45. Should be #90. OR Nucynta 75mg  1/2-1 bid #45. Patient said he would take this as a 2 week Rx but I pointed out that his insurance may not cover 2 scripts in 1 month. Asked that we replace Rx. He will come on Monday to pick up. Will this be okay?

## 2011-03-11 NOTE — Telephone Encounter (Signed)
Received Rx today for 50mg  Nucynta, should be 75mg  Nucynta.  Please advise.

## 2011-03-11 NOTE — Progress Notes (Signed)
Subjective:    Patient ID: Eric Casey, male    DOB: [birth year], 36 y.o.   MRN: [id number]  HPI A 36 year old male with lumbar degenerative disk, chronic low back pain,  work-related injury, able to maintain work status on low-dose Nucynta,  working 40 hours a week in [employer]. Oswestry scores have been  stable over the year 34%, which is in his normal range. Numeric pain  rating score is 7 which is in his normal range. Walking tolerance is 60  minutes which is his usual report. He is able climb steps. He is able  to drive. Generally pain in the lower extremities has been a left side in the side causing some tingling but more recently has involved the right lower extremity. No recent trauma. This has been gradual onset Pain Inventory Average Pain 6 Pain Right Now 8 My pain is sharp and burning  In the last 24 hours, has pain interfered with the following? General activity 6 Relation with others 4 Enjoyment of life 5 What TIME of day is your pain at its worst? evening Sleep (in general) Good  Pain is worse with: some activites Pain improves with: rest and medication Relief from Meds: 6  Mobility walk without assistance how many minutes can you walk? 60 ability to climb steps?  yes do you drive?  yes Do you have any goals in this area?  no  Function employed # of hrs/week 40 what is your job? [employer]  Neuro/Psych No problems in this area  Prior Studies Any changes since last visit?  no September 2012 x-rays  *RADIOLOGY REPORT*  Clinical Data: Low back and left leg pain  LUMBAR SPINE - COMPLETE WITH BENDING VIEWS  Comparison: MR 09/29/2009  Findings: No evidence of dynamic instability on lateral flexion  and extension radiographs. There is no evidence of lumbar spine  fracture. Alignment is normal. Intervertebral disc spaces are  maintained.  IMPRESSION:  Negative.  Original Report Authenticated By: Lysle Rubens                Physicians involved in  your care Any changes since last visit?  no   Review of Systems  Constitutional: Negative.   HENT: Negative.   Eyes: Negative.   Respiratory: Negative.   Cardiovascular: Negative.   Gastrointestinal: Negative.   Genitourinary: Negative.   Musculoskeletal: Negative.   Skin: Negative.   Neurological: Negative.   Hematological: Negative.   Psychiatric/Behavioral: Negative.        Objective:   Physical Exam  Constitutional: He is oriented to person, place, and time. He appears well-developed and well-nourished.  Eyes: Pupils are equal, round, and reactive to light.  Neurological: He is alert and oriented to person, place, and time. He has normal strength. A sensory deficit is present.  Reflex Scores:      Tricep reflexes are 2+ on the right side and 2+ on the left side.      Bicep reflexes are 2+ on the right side and 2+ on the left side.      Brachioradialis reflexes are 2+ on the right side and 2+ on the left side.      Patellar reflexes are 2+ on the right side and 2+ on the left side.      Achilles reflexes are 2+ on the right side and 2+ on the left side.      Reduced right L5 sensation Reduced left L3 and left L4 sensation  Motor strength is normal bilateral hip flexors, knee  flexors, knee extensors, ankle dorsiflexors, EHL          Assessment & Plan:  1. Right lumbar radiculitis L5 dermatomal distribution. This is a new finding. He had no discrete injury however I do feel he has some inflammation and therefore I'm ordering Medrol Dosepak. He'll return in one month and if no better or certainly if worse would repeat MRI lumbar spine. Discussed with patient and wife agree with plan.  2. Lumbar degenerative disc chronic pain managed well with current medications overall. He will need to come off his Celebrex while he is on the Medrol Dosepak.

## 2011-03-11 NOTE — Patient Instructions (Signed)
Stop Celebrex while on the Medrol Dosepak

## 2011-03-14 ENCOUNTER — Other Ambulatory Visit: Payer: Self-pay | Admitting: *Deleted

## 2011-03-14 MED ORDER — TAPENTADOL HCL 75 MG PO TABS: 1.0000 | ORAL_TABLET | Freq: Two times a day (BID) | ORAL | Status: DC

## 2011-04-06 ENCOUNTER — Encounter: Payer: Self-pay | Admitting: Physical Medicine & Rehabilitation

## 2011-04-15 ENCOUNTER — Encounter: Payer: Self-pay | Admitting: Physical Medicine & Rehabilitation

## 2011-04-15 ENCOUNTER — Encounter: Payer: Worker's Compensation | Attending: Neurosurgery

## 2011-04-15 ENCOUNTER — Ambulatory Visit (HOSPITAL_BASED_OUTPATIENT_CLINIC_OR_DEPARTMENT_OTHER): Payer: Worker's Compensation | Admitting: Physical Medicine & Rehabilitation

## 2011-04-15 VITALS — BP 146/78 | HR 102 | Resp 18 | Ht 69.0 in | Wt 235.0 lb

## 2011-04-15 DIAGNOSIS — M51379 Other intervertebral disc degeneration, lumbosacral region without mention of lumbar back pain or lower extremity pain: Secondary | ICD-10-CM | POA: Insufficient documentation

## 2011-04-15 DIAGNOSIS — I10 Essential (primary) hypertension: Secondary | ICD-10-CM | POA: Insufficient documentation

## 2011-04-15 DIAGNOSIS — M545 Low back pain, unspecified: Secondary | ICD-10-CM | POA: Insufficient documentation

## 2011-04-15 DIAGNOSIS — M5430 Sciatica, unspecified side: Secondary | ICD-10-CM

## 2011-04-15 DIAGNOSIS — M5137 Other intervertebral disc degeneration, lumbosacral region: Secondary | ICD-10-CM

## 2011-04-15 MED ORDER — TAPENTADOL HCL 50 MG PO TABS: 50.0000 mg | ORAL_TABLET | Freq: Three times a day (TID) | ORAL | Status: DC

## 2011-04-15 NOTE — Patient Instructions (Addendum)
Change pain medicine Consider acupuncture Consider voc rehab

## 2011-04-15 NOTE — Progress Notes (Signed)
  Subjective:    Patient ID: Eric Casey, male    DOB: 25-Nov-1975, 36 y.o.   MRN: [id number] Medrol dose pack helped back pain until it was completed. Felt nauseated, ill while he was taking A 36 year old male with lumbar degenerative disk, chronic low back pain,  work-related injury, able to maintain work status on low-dose Nucynta,  working 40 hours a week in [employer]. Oswestry scores have been  stable over the year 34%, which is in his normal range. Numeric pain  rating score is 7 which is in his normal range. Walking tolerance is 60  minutes which is his usual report. He is able climb steps. He is able  to drive.  Generally pain in the lower extremities has been a left side in the side causing some tingling but more   recently has involved the right lower extremity. No recent trauma. This has been gradual onset  Pain has been more bothersome over the last couple months. No change in work activities. We discussed medications as well as schedule.  Pain Inventory Average Pain 7 Pain Right Now 8 My pain is intermittent, sharp and stabbing  In the last 24 hours, has pain interfered with the following? General activity 8 Relation with others 8 Enjoyment of life 8 What TIME of day is your pain at its worst? during the day and in the evening Sleep (in general) Fair  Pain is worse with: walking, bending and some activites Pain improves with: rest, heat/ice and medication Relief from Meds: 5  Mobility walk without assistance how many minutes can you walk? 60 ability to climb steps?  yes do you drive?  yes Do you have any goals in this area?  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  no  Physicians involved in your care Any changes since last visit?  no      .HPI    Review of Systems  Constitutional: Negative.   HENT: Negative.   Eyes: Negative.     Respiratory: Negative.   Cardiovascular: Negative.   Gastrointestinal:       Back pain w/ straining w/ stool  Genitourinary: Positive for dysuria (pain in back w/ urination).  Musculoskeletal: Positive for back pain.  Skin: Negative.   Neurological: Negative.   Hematological: Negative.   Psychiatric/Behavioral: Negative.        Objective:   Physical Exam  Constitutional: He is oriented to person, place, and time.  Musculoskeletal:       Lumbar back: He exhibits decreased range of motion. He exhibits no tenderness, no swelling and no deformity.       Straight leg raising test is negative  Neurological: He is alert and oriented to person, place, and time. He has normal strength. No sensory deficit.          Assessment & Plan:  1. Lumbar degenerative disc with chronic low back pain. The patient is on a low dose of pain medicine and will increase it this month and reassess next month. I do not think any further MRIs are needed at this point unless symptoms change

## 2011-05-13 ENCOUNTER — Encounter: Payer: Self-pay | Admitting: Physical Medicine & Rehabilitation

## 2011-05-13 ENCOUNTER — Ambulatory Visit (HOSPITAL_BASED_OUTPATIENT_CLINIC_OR_DEPARTMENT_OTHER): Payer: Worker's Compensation | Admitting: Physical Medicine & Rehabilitation

## 2011-05-13 ENCOUNTER — Encounter: Payer: Worker's Compensation | Attending: Neurosurgery

## 2011-05-13 VITALS — BP 136/78 | HR 81 | Resp 16 | Ht 69.0 in | Wt 237.0 lb

## 2011-05-13 DIAGNOSIS — M545 Low back pain, unspecified: Secondary | ICD-10-CM | POA: Insufficient documentation

## 2011-05-13 DIAGNOSIS — M5137 Other intervertebral disc degeneration, lumbosacral region: Secondary | ICD-10-CM

## 2011-05-13 DIAGNOSIS — M51379 Other intervertebral disc degeneration, lumbosacral region without mention of lumbar back pain or lower extremity pain: Secondary | ICD-10-CM | POA: Insufficient documentation

## 2011-05-13 DIAGNOSIS — M5430 Sciatica, unspecified side: Secondary | ICD-10-CM

## 2011-05-13 DIAGNOSIS — I10 Essential (primary) hypertension: Secondary | ICD-10-CM | POA: Insufficient documentation

## 2011-05-13 MED ORDER — TAPENTADOL HCL 50 MG PO TABS: 50.0000 mg | ORAL_TABLET | Freq: Four times a day (QID) | ORAL | Status: DC | PRN

## 2011-05-13 MED ORDER — METHYLPREDNISOLONE 4 MG PO KIT: PACK | ORAL | Status: AC

## 2011-05-13 NOTE — Progress Notes (Signed)
  Subjective:    Patient ID: Eric Casey, male    DOB: 11/26/1975, 36 y.o.   MRN: [id number]  HPI 3 weeks ago had flare up of pain while squatting. He was able can any of his work with increased pain. However starting last week he had pain that prohibited him from working. Pain is also radiating down to both knees and sometimes to the left ankle. No loss of bowel or bladder function. On this and tingling worse on the left side all the way down to the ankle. Also on the right side to the knee.  Pain Inventory Average Pain 7 Pain Right Now 9 My pain is constant, sharp and stabbing  In the last 24 hours, has pain interfered with the following? General activity 9 Relation with others 8 Enjoyment of life 8 What TIME of day is your pain at its worst? daytime and evening Sleep (in general) Fair  Pain is worse with: walking and standing Pain improves with: heat/ice and medication Relief from Meds: 6  Mobility walk without assistance how many minutes can you walk? 60 ability to climb steps?  yes do you drive?  yes Do you have any goals in this area?  no  Function employed # of hrs/week 40  Neuro/Psych numbness tingling  Prior Studies Any changes since last visit?  no  Physicians involved in your care Any changes since last visit?  no      Review of Systems  Neurological: Positive for numbness.  All other systems reviewed and are negative.       Objective:   Physical Exam  Musculoskeletal:       Lumbar back: He exhibits decreased range of motion and tenderness. He exhibits no swelling, no edema and no deformity.       Reduced lumbar extension greater than lumbar flexion. Tenderness at L5-S1 junction in the paraspinal muscles  Neurological: He has normal strength.  Reflex Scores:      Tricep reflexes are 2+ on the right side and 2+ on the left side.      Bicep reflexes are 2+ on the right side and 2+ on the left side.      Brachioradialis reflexes are 2+ on  the right side and 2+ on the left side.      Patellar reflexes are 2+ on the right side and 2+ on the left side.      Achilles reflexes are 2+ on the right side and 2+ on the left side.      Diminished L5 and S1 on left diminished L4 on the right diminished L3 on left.       Assessment & Plan:  1. Degenerative disc disease with sciatica. He appears to have had an exacerbation of his complaints. I agree with neurosurgery reevaluation and likely will need MRI as well. His exam has no significant changes other than perhaps some more sensory abnormalities on the left side. We'll keep mod work until neurosurgery reevaluation. I'll see him back in one month We'll give her Medrol Dosepak New since the increased 4 times per day

## 2011-05-13 NOTE — Patient Instructions (Signed)
Will take you out of work until you are reevaluated by Dr. Venetia Maxon from neurosurgery. You have an appointment with him on Wednesday. He can decide whether you need a repeat MRI. I would recommend a repeat MRI.

## 2011-06-06 ENCOUNTER — Ambulatory Visit: Payer: Worker's Compensation | Admitting: Physical Medicine & Rehabilitation

## 2011-06-07 ENCOUNTER — Encounter: Payer: Worker's Compensation | Attending: Neurosurgery

## 2011-06-07 ENCOUNTER — Encounter: Payer: Self-pay | Admitting: Physical Medicine & Rehabilitation

## 2011-06-07 ENCOUNTER — Ambulatory Visit (HOSPITAL_BASED_OUTPATIENT_CLINIC_OR_DEPARTMENT_OTHER): Payer: Worker's Compensation | Admitting: Physical Medicine & Rehabilitation

## 2011-06-07 VITALS — BP 138/70 | HR 71 | Resp 16 | Ht 69.0 in | Wt 238.2 lb

## 2011-06-07 DIAGNOSIS — M545 Low back pain, unspecified: Secondary | ICD-10-CM | POA: Insufficient documentation

## 2011-06-07 DIAGNOSIS — I10 Essential (primary) hypertension: Secondary | ICD-10-CM | POA: Insufficient documentation

## 2011-06-07 DIAGNOSIS — M51379 Other intervertebral disc degeneration, lumbosacral region without mention of lumbar back pain or lower extremity pain: Secondary | ICD-10-CM | POA: Insufficient documentation

## 2011-06-07 DIAGNOSIS — M5137 Other intervertebral disc degeneration, lumbosacral region: Secondary | ICD-10-CM | POA: Insufficient documentation

## 2011-06-07 MED ORDER — DICLOFENAC EPOLAMINE 1.3 % TD PTCH: 1.0000 | MEDICATED_PATCH | Freq: Two times a day (BID) | TRANSDERMAL | Status: DC

## 2011-06-07 MED ORDER — TAPENTADOL HCL 50 MG PO TABS: 50.0000 mg | ORAL_TABLET | Freq: Four times a day (QID) | ORAL | Status: DC | PRN

## 2011-06-07 NOTE — Patient Instructions (Addendum)
Continue with medications, Continue with walking program, start swimming, especially back stroke. Also do core exercises I showed you.

## 2011-06-07 NOTE — Progress Notes (Signed)
Subjective:    Patient ID: Eric Casey, male    DOB: 12-21-1975, 36 y.o.   MRN: [id number]  HPI The patient complains about chronic low back  pain which radiates into his LE bilateral.  The problem has been stable. The patient saw Dr. Larina Bras since the last visit reported with MRI. The MRI showed some disc bulging at L3-4 and L4- 5, worse at L4-5. The patient states, that  Dr. Venetia Maxon stated, that the buldging of the disc has increased some, but he also stated, that the patient is not a surgical candidate at that time.   Pain Inventory Average Pain 7 Pain Right Now 6 My pain is constant and aching  In the last 24 hours, has pain interfered with the following? General activity 6 Relation with others 6 Enjoyment of life 7 What TIME of day is your pain at its worst? evening Sleep (in general) Fair  Pain is worse with: walking and some activites Pain improves with: rest, heat/ice and medication Relief from Meds: 6  Mobility walk without assistance how many minutes can you walk? 60 ability to climb steps?  yes do you drive?  yes  Function disabled: date disabled 05/04/11  Neuro/Psych numbness anxiety  Prior Studies Any changes since last visit?  no  Physicians involved in your care Any changes since last visit?  no    Family History  Problem Relation Age of Onset  . Heart disease Father   . Hypertension Father    History   Social History  . Marital Status: Married    Spouse Name: N/A    Number of Children: N/A  . Years of Education: N/A   Social History Main Topics  . Smoking status: Former Smoker    Types: Cigarettes    Quit date: 04/15/2006  . Smokeless tobacco: Never Used  . Alcohol Use: No  . Drug Use: No  . Sexually Active: None   Other Topics Concern  . None   Social History Narrative  . None   History reviewed. No pertinent past surgical history. Past Medical History  Diagnosis Date  . High blood pressure   . Back pain, chronic   .  Buttock pain    BP 138/70  Pulse 71  Resp 16  Ht 5\' 9"  (1.753 m)  Wt 238 lb 3.2 oz (108.047 kg)  BMI 35.18 kg/m2  SpO2 98%   Review of Systems  Musculoskeletal: Positive for back pain.  Neurological: Positive for numbness.  Psychiatric/Behavioral: The patient is nervous/anxious.   All other systems reviewed and are negative.       Objective:   Physical Exam  Constitutional: He is oriented to person, place, and time. He appears well-developed and well-nourished.  HENT:  Head: Normocephalic.  Eyes: Pupils are equal, round, and reactive to light.  Neck: Normal range of motion.  Neurological: He is alert and oriented to person, place, and time.  Skin: Skin is warm and dry.  Psychiatric: He has a normal mood and affect.    Symmetric normal motor tone is noted throughout. Normal muscle bulk. Muscle testing reveals 5/5 muscle strength of the upper extremity, and 5/5 of the lower extremity. Full range of motion in upper and lower extremities. ROM of spine is  restricted. Fine motor movements are normal in both hands. Sensory is intact and symmetric to light touch, pinprick and proprioception. DTR in the upper and lower extremity are present and symmetric 2+. No clonus is noted.  Patient arises from chair without  difficulty. Narrow based gait with normal arm swing bilateral , able to walk on heels and toes . Tandem walk is stable. No pronator drift.    Tenderness at level of L4 bilateral.      Assessment & Plan:  This is a 36 year old  male with 1.DDD in L-spine 2.Sciatica 3.Buldging discs, L3-4 and L4-5, worse at L4-5 4.LBP, radiating into LE bilateral Plan : Patient should continue with his walking program I also showed him some core strengthening exercises which he should do regular rate, he also should start a swimming program especially the back stroke would be very beneficial for this problem. He should continue with this medication, I also will prescribed him Flexeril  patches for exacerbation. If all of these treatments will not improve his problem we might consider injections. The patient was taking out of work by Dr. Venetia Maxon  and he should stay out of work. Nurse case manager Velna Hatchet Ward was present at the visit, and agreed with the plan. Follow up in 1 month.

## 2011-06-13 ENCOUNTER — Ambulatory Visit: Payer: Worker's Compensation | Admitting: Physical Medicine & Rehabilitation

## 2011-07-06 ENCOUNTER — Encounter
Payer: Worker's Compensation | Attending: Physical Medicine & Rehabilitation | Admitting: Physical Medicine and Rehabilitation

## 2011-07-06 ENCOUNTER — Encounter: Payer: Self-pay | Admitting: Physical Medicine and Rehabilitation

## 2011-07-06 VITALS — BP 124/71 | HR 89 | Resp 16 | Ht 69.0 in | Wt 237.0 lb

## 2011-07-06 DIAGNOSIS — M545 Low back pain, unspecified: Secondary | ICD-10-CM

## 2011-07-06 DIAGNOSIS — M5430 Sciatica, unspecified side: Secondary | ICD-10-CM | POA: Insufficient documentation

## 2011-07-06 DIAGNOSIS — M5137 Other intervertebral disc degeneration, lumbosacral region: Secondary | ICD-10-CM | POA: Insufficient documentation

## 2011-07-06 DIAGNOSIS — G8929 Other chronic pain: Secondary | ICD-10-CM | POA: Insufficient documentation

## 2011-07-06 DIAGNOSIS — M51379 Other intervertebral disc degeneration, lumbosacral region without mention of lumbar back pain or lower extremity pain: Secondary | ICD-10-CM | POA: Insufficient documentation

## 2011-07-06 DIAGNOSIS — M79609 Pain in unspecified limb: Secondary | ICD-10-CM | POA: Insufficient documentation

## 2011-07-06 MED ORDER — TAPENTADOL HCL 50 MG PO TABS: 50.0000 mg | ORAL_TABLET | Freq: Four times a day (QID) | ORAL | Status: DC | PRN

## 2011-07-06 NOTE — Progress Notes (Signed)
Subjective:    Patient ID: Eric Casey, male    DOB: 1975/07/07, 36 y.o.   MRN: [id number]  HPI The patient complains about chronic low back pain which radiates into his LE bilateral.  The problem has been stable. The patient saw Dr. Venetia Maxon since the last visit reported with MRI. The MRI showed some disc bulging at L3-4 and L4- 5, worse at L4-5. The patient states, that Dr. Venetia Maxon stated, that the buldging of the disc has increased some, but he also stated, that the patient is not a surgical candidate at that time.  Pain Inventory Average Pain 7 Pain Right Now 7 My pain is sharp, stabbing and aching  In the last 24 hours, has pain interfered with the following? General activity 4 Relation with others 4 Enjoyment of life 5 What TIME of day is your pain at its worst? Daytime and Evening Sleep (in general) Fair  Pain is worse with: walking and some activites Pain improves with: rest, heat/ice and medication Relief from Meds: 6  Mobility walk without assistance how many minutes can you walk? 60 ability to climb steps?  yes do you drive?  yes  Function disabled: date disabled 5/13  Neuro/Psych numbness anxiety  Prior Studies Any changes since last visit?  no  Physicians involved in your care Any changes since last visit?  no   Family History  Problem Relation Age of Onset  . Heart disease Father   . Hypertension Father    History   Social History  . Marital Status: Married    Spouse Name: N/A    Number of Children: N/A  . Years of Education: N/A   Social History Main Topics  . Smoking status: Former Smoker    Types: Cigarettes    Quit date: 04/15/2006  . Smokeless tobacco: Never Used  . Alcohol Use: No  . Drug Use: No  . Sexually Active: None   Other Topics Concern  . None   Social History Narrative  . None   History reviewed. No pertinent past surgical history. Past Medical History  Diagnosis Date  . High blood pressure   . Back pain,  chronic   . Buttock pain    BP 124/71  Pulse 89  Resp 16  Ht 5\' 9"  (1.753 m)  Wt 237 lb (107.502 kg)  BMI 35.00 kg/m2  SpO2 94%      Review of Systems  Constitutional: Negative.   HENT: Negative.   Eyes: Negative.   Respiratory: Negative.   Cardiovascular: Negative.   Gastrointestinal: Negative.   Genitourinary: Negative.   Musculoskeletal: Positive for back pain.  Skin: Negative.   Neurological: Positive for numbness.  Hematological: Negative.   Psychiatric/Behavioral: Negative.        Objective:   Physical Exam Constitutional: He is oriented to person, place, and time. He appears well-developed and well-nourished.  HENT:  Head: Normocephalic.  Eyes: Pupils are equal, round, and reactive to light.  Neck: Normal range of motion.  Neurological: He is alert and oriented to person, place, and time.  Skin: Skin is warm and dry.  Psychiatric: He has a normal mood and affect.   Symmetric normal motor tone is noted throughout. Normal muscle bulk. Muscle testing reveals 5/5 muscle strength of the upper extremity, and 5/5 of the lower extremity. Full range of motion in upper and lower extremities. ROM of spine is restricted. Fine motor movements are normal in both hands.  Sensory is intact and symmetric to light touch, pinprick and  proprioception.  DTR in the upper and lower extremity are present and symmetric 2+. No clonus is noted.  Patient arises from chair without difficulty. Narrow based gait with normal arm swing bilateral , able to walk on heels and toes . Tandem walk is stable. No pronator drift.  Tenderness at level of L4 bilateral.        Assessment & Plan:  This is a 36 year old male with  1.DDD in L-spine  2.Sciatica  3.Buldging discs, L3-4 and L4-5, worse at L4-5  4.LBP, radiating into LE bilateral  Plan :  Patient should continue with his walking program I also showed him some core strengthening exercises which he should do regular rate, he also should  start a swimming program especially the back stroke would be very beneficial for this problem. He should continue with this medication.  If all of these treatments will not improve his problem we might consider injections. The patient was taking out of work by Dr. Venetia Maxon and he should stay out of work. Recommended to the patient to take a natural supplement , rhodiola  recharge full his irritability, anxiety and lack of energy. Might consider valium, if this supplements does not give him relief.  Follow up in 1 month.

## 2011-07-06 NOTE — Patient Instructions (Signed)
Continue with exercising, continue with walking program. Exercise in your pool.

## 2011-07-06 NOTE — Addendum Note (Signed)
Addended by: Judd Gaudier on: 07/06/2011 01:48 PM   Modules accepted: Orders

## 2011-08-03 ENCOUNTER — Ambulatory Visit: Payer: Self-pay | Admitting: Physical Medicine and Rehabilitation

## 2011-08-08 ENCOUNTER — Encounter
Payer: Worker's Compensation | Attending: Physical Medicine and Rehabilitation | Admitting: Physical Medicine and Rehabilitation

## 2011-08-08 ENCOUNTER — Encounter: Payer: Self-pay | Admitting: Physical Medicine and Rehabilitation

## 2011-08-08 VITALS — BP 114/66 | HR 93 | Resp 14 | Ht 70.0 in | Wt 233.0 lb

## 2011-08-08 DIAGNOSIS — M5430 Sciatica, unspecified side: Secondary | ICD-10-CM | POA: Insufficient documentation

## 2011-08-08 DIAGNOSIS — R454 Irritability and anger: Secondary | ICD-10-CM

## 2011-08-08 DIAGNOSIS — M5137 Other intervertebral disc degeneration, lumbosacral region: Secondary | ICD-10-CM | POA: Insufficient documentation

## 2011-08-08 DIAGNOSIS — M51379 Other intervertebral disc degeneration, lumbosacral region without mention of lumbar back pain or lower extremity pain: Secondary | ICD-10-CM | POA: Insufficient documentation

## 2011-08-08 DIAGNOSIS — G8929 Other chronic pain: Secondary | ICD-10-CM | POA: Insufficient documentation

## 2011-08-08 DIAGNOSIS — M545 Low back pain, unspecified: Secondary | ICD-10-CM | POA: Insufficient documentation

## 2011-08-08 MED ORDER — TAPENTADOL HCL 50 MG PO TABS: 50.0000 mg | ORAL_TABLET | Freq: Four times a day (QID) | ORAL | Status: DC | PRN

## 2011-08-08 NOTE — Patient Instructions (Signed)
Continue with walking program, continue with exercising, try to work out in the pool.

## 2011-08-08 NOTE — Progress Notes (Signed)
Subjective:    Patient ID: Eric Casey, male    DOB: [birth year], 36 y.o.   MRN: [id number]  HPI The patient complains about chronic low back pain which radiates into his LE bilateral.  The problem has been stable. The patient saw Dr. Venetia Maxon since the last visit reported with MRI. The MRI showed some disc bulging at L3-4 and L4- 5, worse at L4-5. The patient states, that Dr. Venetia Maxon stated, that the buldging of the disc has increased some, but he also stated, that the patient is not a surgical candidate at that time.  Pain Inventory Average Pain 6 Pain Right Now 7 My pain is stabbing and aching  In the last 24 hours, has pain interfered with the following? General activity 6 Relation with others 7 Enjoyment of life 6 What TIME of day is your pain at its worst? day and night Sleep (in general) Fair  Pain is worse with: walking and some activites Pain improves with: rest, heat/ice and medication Relief from Meds: 5  Mobility walk without assistance how many minutes can you walk? 60 ability to climb steps?  yes do you drive?  yes Do you have any goals in this area?  no  Function disabled: date disabled 5/1 Do you have any goals in this area?  no  Neuro/Psych anxiety  Prior Studies Any changes since last visit?  no  Physicians involved in your care Any changes since last visit?  no   Family History  Problem Relation Age of Onset  . Heart disease Father   . Hypertension Father    History   Social History  . Marital Status: Married    Spouse Name: N/A    Number of Children: N/A  . Years of Education: N/A   Social History Main Topics  . Smoking status: Former Smoker    Types: Cigarettes    Quit date: 04/15/2006  . Smokeless tobacco: Never Used  . Alcohol Use: No  . Drug Use: No  . Sexually Active: None   Other Topics Concern  . None   Social History Narrative  . None   History reviewed. No pertinent past surgical history. Past Medical History    Diagnosis Date  . High blood pressure   . Back pain, chronic   . Buttock pain    BP 114/66  Pulse 93  Resp 14  Ht 5\' 10"  (1.778 m)  Wt 233 lb (105.688 kg)  BMI 33.43 kg/m2  SpO2 98%    Review of Systems  Musculoskeletal: Positive for back pain.  Psychiatric/Behavioral: The patient is nervous/anxious.   All other systems reviewed and are negative.       Objective:   Physical Exam Constitutional: He is oriented to person, place, and time. He appears well-developed and well-nourished.  HENT:  Head: Normocephalic.  Eyes: Pupils are equal, round, and reactive to light.  Neck: Normal range of motion.  Neurological: He is alert and oriented to person, place, and time.  Skin: Skin is warm and dry.  Psychiatric: He has a normal mood and affect.  Symmetric normal motor tone is noted throughout. Normal muscle bulk. Muscle testing reveals 5/5 muscle strength of the upper extremity, and 5/5 of the lower extremity. Full range of motion in upper and lower extremities. ROM of spine is restricted. Fine motor movements are normal in both hands.  Sensory is intact and symmetric to light touch, pinprick and proprioception.  DTR in the upper and lower extremity are present and symmetric 2+.  No clonus is noted.  Patient arises from chair without difficulty. Narrow based gait with normal arm swing bilateral , able to walk on heels and toes . Tandem walk is stable. No pronator drift.  Tenderness at level of L4 bilateral.        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 He should continue with this medication. If all of  these treatments will not improve his problem we might consider injections. The patient was taking out of work by Dr. Venetia Maxon and he should stay out of work. Ordered a referral to psychiatry for the patient's irritability and anxiety. and lack of energy.  Follow up in 1 month.

## 2011-08-12 ENCOUNTER — Telehealth: Payer: Self-pay | Admitting: *Deleted

## 2011-08-12 NOTE — Telephone Encounter (Signed)
All the pt needs is for [place] to call the pharmacy to have this filled early since he is leaving for out of town. I will call the pharmacy to give them the okay to fill this for him.

## 2011-08-12 NOTE — Telephone Encounter (Signed)
Refill Nucynta. °

## 2011-09-07 ENCOUNTER — Other Ambulatory Visit: Payer: Self-pay | Admitting: *Deleted

## 2011-09-08 ENCOUNTER — Encounter: Payer: Self-pay | Admitting: Physical Medicine & Rehabilitation

## 2011-09-08 ENCOUNTER — Encounter: Payer: Worker's Compensation | Attending: Neurosurgery

## 2011-09-08 ENCOUNTER — Ambulatory Visit (HOSPITAL_BASED_OUTPATIENT_CLINIC_OR_DEPARTMENT_OTHER): Payer: Worker's Compensation | Admitting: Physical Medicine & Rehabilitation

## 2011-09-08 ENCOUNTER — Encounter: Payer: Worker's Compensation | Admitting: Physical Medicine and Rehabilitation

## 2011-09-08 VITALS — BP 132/86 | HR 82 | Resp 16 | Ht 70.0 in | Wt 232.0 lb

## 2011-09-08 DIAGNOSIS — M545 Low back pain, unspecified: Secondary | ICD-10-CM | POA: Insufficient documentation

## 2011-09-08 DIAGNOSIS — M5430 Sciatica, unspecified side: Secondary | ICD-10-CM

## 2011-09-08 DIAGNOSIS — M5137 Other intervertebral disc degeneration, lumbosacral region: Secondary | ICD-10-CM

## 2011-09-08 DIAGNOSIS — M51379 Other intervertebral disc degeneration, lumbosacral region without mention of lumbar back pain or lower extremity pain: Secondary | ICD-10-CM | POA: Insufficient documentation

## 2011-09-08 DIAGNOSIS — I10 Essential (primary) hypertension: Secondary | ICD-10-CM | POA: Insufficient documentation

## 2011-09-08 MED ORDER — TAPENTADOL HCL ER 100 MG PO TB12: 100.0000 mg | ORAL_TABLET | Freq: Two times a day (BID) | ORAL | Status: DC

## 2011-09-08 NOTE — Patient Instructions (Signed)
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

## 2011-09-08 NOTE — Progress Notes (Signed)
Subjective:    Patient ID: Eric Casey, male    DOB: 10-19-1975, 36 y.o.   MRN: [id number]  HPI Out of work per neurosurgery, getting GED Has seen second opinion PMNR physician, I reviewed her recommendations with the patient as well as the case manager at length. We went through the pros and cons of each of the various treatment alternatives. We discussed his mood and the fact that his current short acting pain medication may be causing some ups and downs He is doing walking on a regular basis. Tried to lose weight.  Pain Inventory Average Pain 7 Pain Right Now 7 My pain is sharp, stabbing and aching  In the last 24 hours, has pain interfered with the following? General activity 5 Relation with others 6 Enjoyment of life 6 What TIME of day is your pain at its worst? daytime Sleep (in general) Fair  Pain is worse with: walking, standing and some activites Pain improves with: rest, heat/ice and medication Relief from Meds: 4  Mobility how many minutes can you walk? 60 ability to climb steps?  yes do you drive?  yes  Function not employed: date last employed 05/04/11  Neuro/Psych No problems in this area  Prior Studies Any changes since last visit?  no  Physicians involved in your care Any changes since last visit?  no   Family History  Problem Relation Age of Onset  . Heart disease Father   . Hypertension Father    History   Social History  . Marital Status: Married    Spouse Name: N/A    Number of Children: N/A  . Years of Education: N/A   Social History Main Topics  . Smoking status: Former Smoker    Types: Cigarettes    Quit date: 04/15/2006  . Smokeless tobacco: Never Used  . Alcohol Use: No  . Drug Use: No  . Sexually Active: None   Other Topics Concern  . None   Social History Narrative  . None   History reviewed. No pertinent past surgical history. Past Medical History  Diagnosis Date  . High blood pressure   . Back pain,  chronic   . Buttock pain    BP 132/86  Pulse 82  Resp 16  Ht 5\' 10"  (1.778 m)  Wt 232 lb (105.235 kg)  BMI 33.29 kg/m2  SpO2 98%      Review of Systems  Constitutional: Negative.   HENT: Negative.   Eyes: Negative.   Respiratory: Negative.   Cardiovascular: Negative.   Gastrointestinal: Negative.   Genitourinary: Negative.   Musculoskeletal: Positive for back pain.  Skin: Negative.   Neurological: Negative.   Hematological: Negative.   Psychiatric/Behavioral: Negative.        Objective:   Physical Exam  Constitutional: He is oriented to person, place, and time. He appears well-developed and well-nourished.  Neck: Normal range of motion.  Musculoskeletal:       Lumbar back: He exhibits decreased range of motion. He exhibits no tenderness.       Negative straight leg raising test Reduced range of motion to 25% of normal for flexion, extension, lateral bending  Neurological: He is alert and oriented to person, place, and time. He has normal strength and normal reflexes. A sensory deficit is present. Gait normal.       Decreased left L4 dermatome and sedation to pin  Psychiatric: He has a normal mood and affect.          Assessment & Plan:  1. 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 We had a 45 minute visit today half of which involves counseling and coordination of care Case manager came in after the examination formed. I answered questions from the case manager as well as the patient and his wife. We will trial Nucynta ER 100 mg twice a day Return in one month Continue walking Continue vocational rehabilitation which he has been on his own Restrictions as per neurosurgery

## 2011-09-13 ENCOUNTER — Telehealth: Payer: Self-pay | Admitting: *Deleted

## 2011-09-13 MED ORDER — CELECOXIB 200 MG PO CAPS: 200.0000 mg | ORAL_CAPSULE | Freq: Every day | ORAL | Status: DC

## 2011-09-13 NOTE — Telephone Encounter (Signed)
Rx has been called in  

## 2011-09-13 NOTE — Telephone Encounter (Signed)
Refill Celebrex. You can fax it in to [phone] or call it in to [phone].

## 2011-09-27 ENCOUNTER — Other Ambulatory Visit: Payer: Self-pay | Admitting: *Deleted

## 2011-09-27 ENCOUNTER — Telehealth: Payer: Self-pay

## 2011-09-27 MED ORDER — CYCLOBENZAPRINE HCL 5 MG PO TABS: 5.0000 mg | ORAL_TABLET | Freq: Two times a day (BID) | ORAL | Status: DC | PRN

## 2011-09-27 NOTE — Telephone Encounter (Signed)
Is this change ok? 

## 2011-09-27 NOTE — Telephone Encounter (Signed)
Patient says extended release nucynta is not helping.  He wants to go back to the other nucynta.   Has appointment in a couple weeks but would like new script before then.

## 2011-09-28 ENCOUNTER — Telehealth: Payer: Self-pay | Admitting: *Deleted

## 2011-09-28 NOTE — Telephone Encounter (Signed)
Patient aware we are waiting on a response.  He will schedule appointment.

## 2011-09-28 NOTE — Telephone Encounter (Signed)
I am not sure, because he already got a one month prescription for the Nucynta ER, so we might not be able to prescribe another prescription this month, please ask Dr. Doroteo Bradford.

## 2011-09-28 NOTE — Telephone Encounter (Signed)
Calling about Nucynta prescription. 2nd call.

## 2011-10-03 ENCOUNTER — Ambulatory Visit (HOSPITAL_BASED_OUTPATIENT_CLINIC_OR_DEPARTMENT_OTHER): Payer: Worker's Compensation | Admitting: Physical Medicine & Rehabilitation

## 2011-10-03 ENCOUNTER — Encounter: Payer: Self-pay | Admitting: Physical Medicine & Rehabilitation

## 2011-10-03 VITALS — BP 119/74 | HR 76 | Resp 16 | Ht 70.0 in | Wt 231.0 lb

## 2011-10-03 DIAGNOSIS — M5137 Other intervertebral disc degeneration, lumbosacral region: Secondary | ICD-10-CM

## 2011-10-03 DIAGNOSIS — M5430 Sciatica, unspecified side: Secondary | ICD-10-CM

## 2011-10-03 MED ORDER — TAPENTADOL HCL 50 MG PO TABS: 50.0000 mg | ORAL_TABLET | Freq: Four times a day (QID) | ORAL | Status: DC | PRN

## 2011-10-03 NOTE — Patient Instructions (Signed)
Resumed nucynta regular release

## 2011-10-03 NOTE — Progress Notes (Signed)
Subjective:    Patient ID: Eric Casey, male    DOB: 1975-06-11, 36 y.o.   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  Push ups Patient feels like he can get by on the nucynta 50 mg 3 times per day Pain Inventory Average Pain 6 Pain Right Now 7 My pain is sharp and aching  In the last 24 hours, has pain interfered with the following? General activity 5 Relation with others 6 Enjoyment of life 5 What TIME of day is your pain at its worst? daytime Sleep (in general) Fair  Pain is worse with: some activites Pain improves with: rest, heat/ice and medication Relief from Meds: 7  Mobility ability to climb steps?  yes do you drive?  yes  Function not employed: date last employed 05/2009  Neuro/Psych No problems in this area  Prior Studies Any changes since last visit?  no  Physicians involved in your care Any changes since last visit?  no   Family History  Problem Relation Age of Onset  . Heart disease Father   . Hypertension Father    History   Social History  . Marital Status: Married    Spouse Name: N/A    Number of Children: N/A  . Years of Education: N/A   Social History Main Topics  . Smoking status: Former Smoker    Types: Cigarettes    Quit date: 04/15/2006  . Smokeless tobacco: Never Used  . Alcohol Use: No  . Drug Use: No  . Sexually Active: None   Other Topics Concern  . None   Social History Narrative  . None   History reviewed. No pertinent past surgical history. Past Medical History  Diagnosis Date  . High blood pressure   . Back pain, chronic   . Buttock pain    BP 119/74  Pulse 76  Resp 16  Ht 5\' 10"  (1.778 m)  Wt 231 lb (104.781 kg)  BMI 33.15 kg/m2  SpO2 99%      Review of Systems  Constitutional: Negative.   HENT: Negative.   Eyes: Negative.   Respiratory: Negative.   Cardiovascular: Negative.   Gastrointestinal:  Negative.   Genitourinary: Negative.   Musculoskeletal: Positive for myalgias, back pain and arthralgias.  Skin: Negative.   Neurological: Negative.   Hematological: Negative.   Psychiatric/Behavioral: Negative.        Objective:   Physical Exam Constitutional: He is oriented to person, place, and time. He appears well-developed and well-nourished.  Neck: Normal range of motion.  Musculoskeletal:  Lumbar back: He exhibits decreased range of motion. He exhibits no tenderness.  Negative straight leg raising test Reduced range of motion to 25% of normal for flexion, extension, lateral bending  Neurological: He is alert and oriented to person, place, and time. He has normal strength and normal reflexes. A sensory deficit is present. Gait normal.  Decreased left L4 dermatome and sedation to pin but normal lt touch Psychiatric: He has a normal mood and affect.         Assessment & Plan:  1. 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 Patient feels that overall this is causing more sedation than the short acting. We will resume short acting Nucynta at 50 mg 3 times per day. Continue home exercise program including walking and core exercises strengthening PA visit  in one month

## 2011-10-11 ENCOUNTER — Ambulatory Visit: Payer: Self-pay | Admitting: Physical Medicine & Rehabilitation

## 2011-11-01 ENCOUNTER — Encounter: Payer: Self-pay | Admitting: Physical Medicine and Rehabilitation

## 2011-11-01 ENCOUNTER — Encounter
Payer: Worker's Compensation | Attending: Physical Medicine and Rehabilitation | Admitting: Physical Medicine and Rehabilitation

## 2011-11-01 VITALS — BP 110/70 | HR 76 | Resp 14 | Ht 69.0 in | Wt 230.0 lb

## 2011-11-01 DIAGNOSIS — M5430 Sciatica, unspecified side: Secondary | ICD-10-CM | POA: Insufficient documentation

## 2011-11-01 DIAGNOSIS — M79609 Pain in unspecified limb: Secondary | ICD-10-CM | POA: Insufficient documentation

## 2011-11-01 DIAGNOSIS — M5137 Other intervertebral disc degeneration, lumbosacral region: Secondary | ICD-10-CM

## 2011-11-01 DIAGNOSIS — M545 Low back pain, unspecified: Secondary | ICD-10-CM | POA: Insufficient documentation

## 2011-11-01 DIAGNOSIS — M5136 Other intervertebral disc degeneration, lumbar region: Secondary | ICD-10-CM

## 2011-11-01 DIAGNOSIS — G8929 Other chronic pain: Secondary | ICD-10-CM | POA: Insufficient documentation

## 2011-11-01 DIAGNOSIS — M51379 Other intervertebral disc degeneration, lumbosacral region without mention of lumbar back pain or lower extremity pain: Secondary | ICD-10-CM | POA: Insufficient documentation

## 2011-11-01 MED ORDER — DICLOFENAC EPOLAMINE 1.3 % TD PTCH: 1.0000 | MEDICATED_PATCH | Freq: Two times a day (BID) | TRANSDERMAL | Status: DC

## 2011-11-01 MED ORDER — TAPENTADOL HCL 50 MG PO TABS: 50.0000 mg | ORAL_TABLET | Freq: Four times a day (QID) | ORAL | Status: DC | PRN

## 2011-11-01 NOTE — Progress Notes (Signed)
Subjective:    Patient ID: Eric Casey, male    DOB: January 26, 1975, 36 y.o.   MRN: [id number]  HPI The patient complains about chronic low back pain which radiates into his LE bilateral.  The problem has been stable. The patient saw Dr. Venetia Maxon since the last visit reported with MRI. The MRI showed some disc bulging at L3-4 and L4- 5, worse at L4-5. The patient states, that Dr. Venetia Maxon stated, that the buldging of the disc has increased some, but he also stated, that the patient was not a surgical candidate at that time. The patient states, that his quality of life has declined because he is in almost constant pain. He states, that his pain has increased since he is going to school and has to sit more. He and his wife reports that he is more irritable and frustrated because of his pain. I referred him to a psychologist to help to deal with this frustration, but WC did not approve. He also is asking whether he could go back on his former dose of Nucynta, which was 50mg  qid, he tried last month to cut down to 50mg  tid, which did not give him sufficient relief.  Pain Inventory Average Pain 8 Pain Right Now 7 My pain is sharp, burning and aching  In the last 24 hours, has pain interfered with the following? General activity 6 Relation with others 6 Enjoyment of life 7 What TIME of day is your pain at its worst? day and evening Sleep (in general) Fair  Pain is worse with: walking, sitting and some activites Pain improves with: rest, heat/ice and medication Relief from Meds: 6  Mobility walk without assistance how many minutes can you walk? 60 ability to climb steps?  yes do you drive?  yes Do you have any goals in this area?  no  Function not employed: date last employed 05/1999 Do you have any goals in this area?  no  Neuro/Psych numbness  Prior Studies Any changes since last visit?  no  Physicians involved in your care Any changes since last visit?  no   Family History    Problem Relation Age of Onset  . Heart disease Father   . Hypertension Father    History   Social History  . Marital Status: Married    Spouse Name: N/A    Number of Children: N/A  . Years of Education: N/A   Social History Main Topics  . Smoking status: Former Smoker    Types: Cigarettes    Quit date: 04/15/2006  . Smokeless tobacco: Never Used  . Alcohol Use: No  . Drug Use: No  . Sexually Active: None   Other Topics Concern  . None   Social History Narrative  . None   History reviewed. No pertinent past surgical history. Past Medical History  Diagnosis Date  . High blood pressure   . Back pain, chronic   . Buttock pain    BP 110/70  Pulse 76  Resp 14  Ht 5\' 9"  (1.753 m)  Wt 230 lb (104.327 kg)  BMI 33.96 kg/m2  SpO2 99%     Review of Systems  Musculoskeletal: Positive for back pain.  Neurological: Positive for numbness.  All other systems reviewed and are negative.       Objective:   Physical Exam Constitutional: He is oriented to person, place, and time. He appears well-developed and well-nourished.  HENT:  Head: Normocephalic.  Eyes: Pupils are equal, round, and reactive to light.  Neck: Normal range of motion.  Neurological: He is alert and oriented to person, place, and time.  Skin: Skin is warm and dry.  Psychiatric: He has a normal mood and affect.  Symmetric normal motor tone is noted throughout. Normal muscle bulk. Muscle testing reveals 5/5 muscle strength of the upper extremity, and 5/5 of the lower extremity. Full range of motion in upper and lower extremities. ROM of spine is restricted. Fine motor movements are normal in both hands.  Sensory is intact and symmetric to light touch, pinprick and proprioception.  DTR in the upper and lower extremity are present and symmetric 2+. No clonus is noted.  Patient arises from chair without difficulty. Narrow based gait with normal arm swing bilateral , able to walk on heels and toes . Tandem  walk is stable. No pronator drift.  Tenderness at level of L4 bilateral.        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 Increased his Nucynta 50mg  back to qid, after speaking with Dr. Doroteo Bradford. Refilled his Flector patches which give him some relief.  Also suggested some injections, but the patient does not want to get steroid injections. The patient is very frustrated about his quality of life, I suggested he should talk to Dr. Venetia Maxon again, and describe his poor quality of life because of his pain, maybe he could be a surgical candidate at this time. Follow up in 1 month.

## 2011-11-01 NOTE — Patient Instructions (Signed)
Continue with your walking and exercise program 

## 2011-11-22 ENCOUNTER — Other Ambulatory Visit: Payer: Self-pay | Admitting: *Deleted

## 2011-11-22 MED ORDER — CELECOXIB 200 MG PO CAPS: 200.0000 mg | ORAL_CAPSULE | Freq: Every day | ORAL | Status: DC

## 2011-11-22 NOTE — Telephone Encounter (Signed)
Celebrex 200mg  was verbally called into [facility] at [phone] option 3.

## 2011-11-29 ENCOUNTER — Encounter: Payer: Self-pay | Admitting: Physical Medicine and Rehabilitation

## 2011-11-29 ENCOUNTER — Encounter
Payer: Worker's Compensation | Attending: Physical Medicine and Rehabilitation | Admitting: Physical Medicine and Rehabilitation

## 2011-11-29 VITALS — BP 117/61 | HR 80 | Resp 16 | Ht 69.0 in | Wt 233.0 lb

## 2011-11-29 DIAGNOSIS — M545 Low back pain, unspecified: Secondary | ICD-10-CM | POA: Insufficient documentation

## 2011-11-29 DIAGNOSIS — M5136 Other intervertebral disc degeneration, lumbar region: Secondary | ICD-10-CM

## 2011-11-29 DIAGNOSIS — M5430 Sciatica, unspecified side: Secondary | ICD-10-CM | POA: Insufficient documentation

## 2011-11-29 DIAGNOSIS — M5137 Other intervertebral disc degeneration, lumbosacral region: Secondary | ICD-10-CM

## 2011-11-29 DIAGNOSIS — M51379 Other intervertebral disc degeneration, lumbosacral region without mention of lumbar back pain or lower extremity pain: Secondary | ICD-10-CM | POA: Insufficient documentation

## 2011-11-29 DIAGNOSIS — G8929 Other chronic pain: Secondary | ICD-10-CM | POA: Insufficient documentation

## 2011-11-29 MED ORDER — TAPENTADOL HCL 50 MG PO TABS: 50.0000 mg | ORAL_TABLET | Freq: Four times a day (QID) | ORAL | Status: DC | PRN

## 2011-11-29 NOTE — Progress Notes (Signed)
Subjective:    Patient ID: Eric Casey, male    DOB: [birth year], 36 y.o.   MRN: [id number]  HPI The patient complains about chronic low back pain which radiates into his LE bilateral.  The problem has been stable. The patient saw Dr. Venetia Maxon since the last visit reported with MRI. The MRI showed some disc bulging at L3-4 and L4- 5, worse at L4-5. The patient states, that Dr. Venetia Maxon stated, that the buldging of the disc has increased some, but he also stated, that the patient was not a surgical candidate at that time. He and his wife reports that he is more irritable and frustrated because of his pain. I referred him to a psychologist to help to deal with this frustration, but WC did not approve.   Pain Inventory Average Pain 7 Pain Right Now 7 My pain is sharp, stabbing and aching  In the last 24 hours, has pain interfered with the following? General activity 6 Relation with others 6 Enjoyment of life 7 What TIME of day is your pain at its worst? unsure Sleep (in general) Fair  Pain is worse with: sitting, standing and some activites Pain improves with: rest, heat/ice and medication Relief from Meds: 5  Mobility walk without assistance ability to climb steps?  yes do you drive?  yes Do you have any goals in this area?  no  Function not employed: date last employed 05/15/11  Neuro/Psych numbness spasms  Prior Studies Any changes since last visit?  no  Physicians involved in your care Any changes since last visit?  no   Family History  Problem Relation Age of Onset  . Heart disease Father   . Hypertension Father    History   Social History  . Marital Status: Married    Spouse Name: N/A    Number of Children: N/A  . Years of Education: N/A   Social History Main Topics  . Smoking status: Former Smoker    Types: Cigarettes    Quit date: 04/15/2006  . Smokeless tobacco: Never Used  . Alcohol Use: No  . Drug Use: No  . Sexually Active: None   Other  Topics Concern  . None   Social History Narrative  . None   History reviewed. No pertinent past surgical history. Past Medical History  Diagnosis Date  . High blood pressure   . Back pain, chronic   . Buttock pain    BP 117/61  Pulse 80  Resp 16  Ht 5\' 9"  (1.753 m)  Wt 233 lb (105.688 kg)  BMI 34.41 kg/m2  SpO2 96%    Review of Systems  Musculoskeletal: Positive for myalgias, back pain and arthralgias.  Neurological: Positive for numbness.       Spasms  All other systems reviewed and are negative.       Objective:   Physical Exam Constitutional: He is oriented to person, place, and time. He appears well-developed and well-nourished.  HENT:  Head: Normocephalic.  Eyes: Pupils are equal, round, and reactive to light.  Neck: Normal range of motion.  Neurological: He is alert and oriented to person, place, and time.  Skin: Skin is warm and dry.  Psychiatric: He has a normal mood and affect.  Symmetric normal motor tone is noted throughout. Normal muscle bulk. Muscle testing reveals 5/5 muscle strength of the upper extremity, and 5/5 of the lower extremity. Full range of motion in upper and lower extremities. ROM of spine is restricted. Fine motor movements are normal  in both hands.  Sensory is intact and symmetric to light touch, pinprick and proprioception.  DTR in the upper and lower extremity are present and symmetric 2+. No clonus is noted.  Patient arises from chair without difficulty. Narrow based gait with normal arm swing bilateral , able to walk on heels and toes . Tandem walk is stable. No pronator drift.  Tenderness at level of L4 bilateral.        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  Also showed him some McKenzie exercises for his buldging discs. Increased his Nucynta 50mg  back to qid, after speaking with Dr. Doroteo Bradford. Also suggested some injections, but the patient does not want to get steroid injections. The patient is very frustrated about his quality of life, I suggested he should talk to Dr. Venetia Maxon again, and describe his poor quality of life because of his pain, maybe he could offer something.   Follow up in 1 month.

## 2011-11-29 NOTE — Patient Instructions (Signed)
Continue with your walking program. Continue with your exercising.

## 2011-12-07 ENCOUNTER — Other Ambulatory Visit: Payer: Self-pay

## 2011-12-07 MED ORDER — CYCLOBENZAPRINE HCL 5 MG PO TABS: 5.0000 mg | ORAL_TABLET | Freq: Two times a day (BID) | ORAL | Status: DC | PRN

## 2011-12-07 NOTE — Telephone Encounter (Signed)
Called patient to clarify flexeril refill request. Patient verified that he gets his workers comp medication through this pharmacy.  Order called into [facility] [phone].

## 2011-12-26 ENCOUNTER — Ambulatory Visit (HOSPITAL_BASED_OUTPATIENT_CLINIC_OR_DEPARTMENT_OTHER): Payer: Worker's Compensation | Admitting: Physical Medicine & Rehabilitation

## 2011-12-26 ENCOUNTER — Encounter: Payer: Worker's Compensation | Attending: Neurosurgery

## 2011-12-26 ENCOUNTER — Encounter: Payer: Self-pay | Admitting: Physical Medicine & Rehabilitation

## 2011-12-26 VITALS — BP 122/73 | HR 90 | Resp 16 | Ht 69.0 in | Wt 230.4 lb

## 2011-12-26 DIAGNOSIS — M5137 Other intervertebral disc degeneration, lumbosacral region: Secondary | ICD-10-CM | POA: Insufficient documentation

## 2011-12-26 DIAGNOSIS — I10 Essential (primary) hypertension: Secondary | ICD-10-CM | POA: Insufficient documentation

## 2011-12-26 DIAGNOSIS — M545 Low back pain, unspecified: Secondary | ICD-10-CM | POA: Insufficient documentation

## 2011-12-26 DIAGNOSIS — M51379 Other intervertebral disc degeneration, lumbosacral region without mention of lumbar back pain or lower extremity pain: Secondary | ICD-10-CM | POA: Insufficient documentation

## 2011-12-26 DIAGNOSIS — M5430 Sciatica, unspecified side: Secondary | ICD-10-CM

## 2011-12-26 MED ORDER — TAPENTADOL HCL 50 MG PO TABS: 50.0000 mg | ORAL_TABLET | Freq: Four times a day (QID) | ORAL | Status: DC | PRN

## 2011-12-26 NOTE — Patient Instructions (Addendum)
The reason for getting job retraining is to find a vocation that is less likely to cause further damage to your spine.

## 2011-12-26 NOTE — Progress Notes (Signed)
  Subjective:    Patient ID: Eric Casey, male    DOB: 02-13-1975, 36 y.o.   MRN: [id number]  HPI Walking doing push ups Fulltime student, for GED Not doing core exercise Pain Inventory Average Pain 6 Pain Right Now 6 My pain is sharp, stabbing and aching  In the last 24 hours, has pain interfered with the following? General activity 5 Relation with others 6 Enjoyment of life 7 What TIME of day is your pain at its worst? daytime and evening Sleep (in general) Fair  Pain is worse with: walking and some activites Pain improves with: rest, heat/ice and medication Relief from Meds: 7  Mobility walk without assistance Do you have any goals in this area?  no  Function not employed: date last employed 5/13 Do you have any goals in this area?  no  Neuro/Psych spasms  Prior Studies Any changes since last visit?  no  Physicians involved in your care Any changes since last visit?  no   Family History  Problem Relation Age of Onset  . Heart disease Father   . Hypertension Father    History   Social History  . Marital Status: Married    Spouse Name: N/A    Number of Children: N/A  . Years of Education: N/A   Social History Main Topics  . Smoking status: Former Smoker    Types: Cigarettes    Quit date: 04/15/2006  . Smokeless tobacco: Never Used  . Alcohol Use: No  . Drug Use: No  . Sexually Active: None   Other Topics Concern  . None   Social History Narrative  . None   History reviewed. No pertinent past surgical history. Past Medical History  Diagnosis Date  . High blood pressure   . Back pain, chronic   . Buttock pain    BP 122/73  Pulse 90  Resp 16  Ht 5\' 9"  (1.753 m)  Wt 230 lb 6.4 oz (104.509 kg)  BMI 34.02 kg/m2  SpO2 97%    Review of Systems  Musculoskeletal: Positive for back pain.  Neurological:       Spasms   All other systems reviewed and are negative.       Objective:   Physical Exam  Nursing note and vitals  reviewed. Constitutional: He is oriented to person, place, and time. He appears well-developed and well-nourished.  Musculoskeletal:       Right hip: Normal.       Left hip: Normal.       Lumbar back: He exhibits tenderness and pain. He exhibits normal range of motion and no spasm.       -SLR  Neurological: He is alert and oriented to person, place, and time. He has normal reflexes.  Psychiatric: He has a normal mood and affect.          Assessment & Plan:  1.  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

## 2012-01-24 ENCOUNTER — Encounter
Payer: Worker's Compensation | Attending: Physical Medicine and Rehabilitation | Admitting: Physical Medicine and Rehabilitation

## 2012-01-24 ENCOUNTER — Encounter: Payer: Self-pay | Admitting: Physical Medicine and Rehabilitation

## 2012-01-24 VITALS — BP 111/69 | HR 83 | Resp 14 | Ht 69.0 in | Wt 234.0 lb

## 2012-01-24 DIAGNOSIS — M5137 Other intervertebral disc degeneration, lumbosacral region: Secondary | ICD-10-CM | POA: Insufficient documentation

## 2012-01-24 DIAGNOSIS — Z5181 Encounter for therapeutic drug level monitoring: Secondary | ICD-10-CM

## 2012-01-24 DIAGNOSIS — M549 Dorsalgia, unspecified: Secondary | ICD-10-CM

## 2012-01-24 DIAGNOSIS — M51379 Other intervertebral disc degeneration, lumbosacral region without mention of lumbar back pain or lower extremity pain: Secondary | ICD-10-CM | POA: Insufficient documentation

## 2012-01-24 DIAGNOSIS — G8929 Other chronic pain: Secondary | ICD-10-CM | POA: Insufficient documentation

## 2012-01-24 DIAGNOSIS — M545 Low back pain, unspecified: Secondary | ICD-10-CM | POA: Insufficient documentation

## 2012-01-24 DIAGNOSIS — M5430 Sciatica, unspecified side: Secondary | ICD-10-CM | POA: Insufficient documentation

## 2012-01-24 MED ORDER — CELECOXIB 200 MG PO CAPS: 200.0000 mg | ORAL_CAPSULE | Freq: Every day | ORAL | Status: DC

## 2012-01-24 MED ORDER — TAPENTADOL HCL 50 MG PO TABS: 50.0000 mg | ORAL_TABLET | Freq: Four times a day (QID) | ORAL | Status: DC | PRN

## 2012-01-24 MED ORDER — DICLOFENAC EPOLAMINE 1.3 % TD PTCH: 1.0000 | MEDICATED_PATCH | Freq: Two times a day (BID) | TRANSDERMAL | Status: DC

## 2012-01-24 NOTE — Patient Instructions (Signed)
Continue with exercising and walking as pain permits. 

## 2012-01-24 NOTE — Progress Notes (Signed)
Subjective:    Patient ID: Eric Casey, male    DOB: 1975-05-01, 37 y.o.   MRN: [id number]  HPI The patient complains about chronic low back pain which radiates into his LE bilateral.  The problem has been stable. The patient saw Dr. Venetia Maxon since the last visit reported with MRI. The MRI showed some disc bulging at L3-4 and L4- 5, worse at L4-5. The patient states, that Dr. Venetia Maxon stated, that the buldging of the disc has increased some, but he also stated, that the patient was not a surgical candidate at that time. He and his wife reports that he is more irritable and frustrated because of his pain at his last visit. I referred him to a psychologist to help to deal with this frustration, but WC did not approve.   Pain Inventory Average Pain 6 Pain Right Now 6 My pain is sharp and stabbing  In the last 24 hours, has pain interfered with the following? General activity 6 Relation with others 6 Enjoyment of life 6 What TIME of day is your pain at its worst? day and evening Sleep (in general) Fair  Pain is worse with: sitting and some activites Pain improves with: rest, heat/ice and medication Relief from Meds: 6  Mobility Do you have any goals in this area?  no  Function not employed: date last employed 5/13 Do you have any goals in this area?  no  Neuro/Psych No problems in this area  Prior Studies Any changes since last visit?  no  Physicians involved in your care Any changes since last visit?  no   Family History  Problem Relation Age of Onset  . Heart disease Father   . Hypertension Father    History   Social History  . Marital Status: Married    Spouse Name: N/A    Number of Children: N/A  . Years of Education: N/A   Social History Main Topics  . Smoking status: Former Smoker    Types: Cigarettes    Quit date: 04/15/2006  . Smokeless tobacco: Never Used  . Alcohol Use: No  . Drug Use: No  . Sexually Active: None   Other Topics Concern  . None    Social History Narrative  . None   History reviewed. No pertinent past surgical history. Past Medical History  Diagnosis Date  . High blood pressure   . Back pain, chronic   . Buttock pain    BP 111/69  Pulse 83  Resp 14  Ht 5\' 9"  (1.753 m)  Wt 234 lb (106.142 kg)  BMI 34.56 kg/m2  SpO2 99%     Review of Systems  Musculoskeletal: Positive for back pain.  All other systems reviewed and are negative.       Objective:   Physical Exam Constitutional: He is oriented to person, place, and time. He appears well-developed and well-nourished.  HENT:  Head: Normocephalic.  Eyes: Pupils are equal, round, and reactive to light.  Neck: Normal range of motion.  Neurological: He is alert and oriented to person, place, and time.  Skin: Skin is warm and dry.  Psychiatric: He has a normal mood and affect.  Symmetric normal motor tone is noted throughout. Normal muscle bulk. Muscle testing reveals 5/5 muscle strength of the upper extremity, and 5/5 of the lower extremity. Full range of motion in upper and lower extremities. ROM of spine is restricted. Fine motor movements are normal in both hands.  Sensory is intact and symmetric to light  touch, pinprick and proprioception.  DTR in the upper and lower extremity are present and symmetric 2+. No clonus is noted.  Patient arises from chair without difficulty. Narrow based gait with normal arm swing bilateral , able to walk on heels and toes . Tandem walk is stable. No pronator drift.  Tenderness at level of L4 bilateral.        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 Also showed him some McKenzie exercises for his buldging discs at his last visit, which he should  continue to do.Refilled his Nucynta 50mg  qid, his flector patches, and his celebrex. Also suggested some injections, but the patient does not want to get steroid injections.  Follow up in 1 month.

## 2012-02-08 ENCOUNTER — Telehealth: Payer: Self-pay | Admitting: *Deleted

## 2012-02-08 NOTE — Telephone Encounter (Signed)
Calling about celebrex refill request they faxed to our office.  fx # [phone]

## 2012-02-09 MED ORDER — CELECOXIB 200 MG PO CAPS: 200.0000 mg | ORAL_CAPSULE | Freq: Every day | ORAL | Status: DC

## 2012-02-09 NOTE — Telephone Encounter (Signed)
Never received fax but script was printed and faxed to [phone].  Patient aware.

## 2012-02-22 ENCOUNTER — Encounter: Payer: Self-pay | Admitting: Physical Medicine and Rehabilitation

## 2012-02-22 ENCOUNTER — Encounter
Payer: Worker's Compensation | Attending: Physical Medicine and Rehabilitation | Admitting: Physical Medicine and Rehabilitation

## 2012-02-22 VITALS — BP 132/74 | HR 97 | Resp 16 | Ht 70.0 in | Wt 234.0 lb

## 2012-02-22 DIAGNOSIS — M5137 Other intervertebral disc degeneration, lumbosacral region: Secondary | ICD-10-CM | POA: Insufficient documentation

## 2012-02-22 DIAGNOSIS — M51379 Other intervertebral disc degeneration, lumbosacral region without mention of lumbar back pain or lower extremity pain: Secondary | ICD-10-CM | POA: Insufficient documentation

## 2012-02-22 DIAGNOSIS — G8929 Other chronic pain: Secondary | ICD-10-CM | POA: Insufficient documentation

## 2012-02-22 DIAGNOSIS — M545 Low back pain, unspecified: Secondary | ICD-10-CM | POA: Insufficient documentation

## 2012-02-22 DIAGNOSIS — M47817 Spondylosis without myelopathy or radiculopathy, lumbosacral region: Secondary | ICD-10-CM

## 2012-02-22 DIAGNOSIS — M5430 Sciatica, unspecified side: Secondary | ICD-10-CM | POA: Insufficient documentation

## 2012-02-22 MED ORDER — TAPENTADOL HCL 50 MG PO TABS: 50.0000 mg | ORAL_TABLET | Freq: Four times a day (QID) | ORAL | Status: DC | PRN

## 2012-02-22 NOTE — Progress Notes (Signed)
Subjective:    Patient ID: Eric Casey, male    DOB: [birth year], 37 y.o.   MRN: [id number]  HPI The patient complains about chronic low back pain which radiates into his LE bilateral.  The problem has been stable. The patient saw Dr. Venetia Maxon since the last visit reported with MRI. The MRI showed some disc bulging at L3-4 and L4- 5, worse at L4-5. The patient states, that Dr. Venetia Maxon stated, that the buldging of the disc has increased some, but he also stated, that the patient was not a surgical candidate at that time. He and his wife reports that he is more irritable and frustrated because of his pain at his last visit. I referred him to a psychologist to help to deal with this frustration, but WC did not approve.  The patient states, that he feels stuck, he thinks his problem is getting worse. He states, that he would be interested in a second opinion, because he really wants to get his back "fixed", and be able to work again.  Pain Inventory Average Pain 6 Pain Right Now 7 My pain is constant, sharp, stabbing and aching  In the last 24 hours, has pain interfered with the following? General activity 6 Relation with others 6 Enjoyment of life 6 What TIME of day is your pain at its worst? day and night Sleep (in general) Fair  Pain is worse with: bending, standing and some activites Pain improves with: rest, heat/ice and medication Relief from Meds: 7  Mobility Do you have any goals in this area?  no  Function disabled: date disabled 05/13 Do you have any goals in this area?  no  Neuro/Psych No problems in this area  Prior Studies Any changes since last visit?  no  Physicians involved in your care Any changes since last visit?  no   Family History  Problem Relation Age of Onset  . Heart disease Father   . Hypertension Father    History   Social History  . Marital Status: Married    Spouse Name: N/A    Number of Children: N/A  . Years of Education: N/A    Social History Main Topics  . Smoking status: Former Smoker    Types: Cigarettes    Quit date: 04/15/2006  . Smokeless tobacco: Never Used  . Alcohol Use: No  . Drug Use: No  . Sexually Active: None   Other Topics Concern  . None   Social History Narrative  . None   History reviewed. No pertinent past surgical history. Past Medical History  Diagnosis Date  . High blood pressure   . Back pain, chronic   . Buttock pain    BP 132/74  Pulse 97  Resp 16  Ht 5\' 10"  (1.778 m)  Wt 234 lb (106.142 kg)  BMI 33.58 kg/m2  SpO2 95%     Review of Systems  Musculoskeletal: Positive for back pain.  All other systems reviewed and are negative.       Objective:   Physical Exam Constitutional: He is oriented to person, place, and time. He appears well-developed and well-nourished.  HENT:  Head: Normocephalic.  Eyes: Pupils are equal, round, and reactive to light.  Neck: Normal range of motion.  Neurological: He is alert and oriented to person, place, and time.  Skin: Skin is warm and dry.  Psychiatric: He has a normal mood and affect.  Symmetric normal motor tone is noted throughout. Normal muscle bulk. Muscle testing reveals 5/5 muscle  strength of the upper extremity, and 5/5 of the lower extremity. Full range of motion in upper and lower extremities. ROM of spine is restricted. Fine motor movements are normal in both hands.  Sensory is intact and symmetric to light touch, pinprick and proprioception.  DTR in the upper and lower extremity are present and symmetric 2+. No clonus is noted.  Patient arises from chair without difficulty. Narrow based gait with normal arm swing bilateral , able to walk on heels and toes . Tandem walk is stable. No pronator drift.  Tenderness at level of L4 bilateral.        Assessment & Plan:  This is a 37 year old male with  1.DDD in L-spine  2.Sciatica  3.Buldging discs, L3-4 and L4-5, worse at L4-5  4.LBP, radiating into LE bilateral   Plan :  Patient should continue with his walking program I also showed him some core strengthening exercises which he should do regularly. Also showed him some McKenzie exercises for his buldging discs at his last visit, which he should continue to do.Refilled his Nucynta 50mg  qid, his flector patches, and his celebrex. Also suggested some injections, but the patient does not want to get steroid injections, he has gotten them in the past without success. The patient states, that he feels stuck, he thinks his problem is getting worse. He states, that he would be interested in a second opinion, because he really wants to get his back "fixed", and be able to work again. It might be a good idea, to get a second opinion, and if this is only to help the patient to deal with his situation, and to deal with his pain. He is exercising regularly, he had injections, but this has not helped with his symptoms. Follow up in 1 month.

## 2012-02-22 NOTE — Patient Instructions (Signed)
Continue with your exercises, you could try a foam roll for deep tissue massage.

## 2012-03-21 ENCOUNTER — Encounter: Payer: Self-pay | Admitting: Physical Medicine and Rehabilitation

## 2012-03-21 ENCOUNTER — Encounter
Payer: Worker's Compensation | Attending: Physical Medicine and Rehabilitation | Admitting: Physical Medicine and Rehabilitation

## 2012-03-21 VITALS — BP 117/80 | HR 94 | Resp 14 | Ht 70.0 in | Wt 234.0 lb

## 2012-03-21 DIAGNOSIS — M79609 Pain in unspecified limb: Secondary | ICD-10-CM | POA: Insufficient documentation

## 2012-03-21 DIAGNOSIS — G8929 Other chronic pain: Secondary | ICD-10-CM | POA: Insufficient documentation

## 2012-03-21 DIAGNOSIS — M5430 Sciatica, unspecified side: Secondary | ICD-10-CM | POA: Insufficient documentation

## 2012-03-21 DIAGNOSIS — M545 Low back pain, unspecified: Secondary | ICD-10-CM | POA: Insufficient documentation

## 2012-03-21 DIAGNOSIS — M5137 Other intervertebral disc degeneration, lumbosacral region: Secondary | ICD-10-CM | POA: Insufficient documentation

## 2012-03-21 DIAGNOSIS — M47817 Spondylosis without myelopathy or radiculopathy, lumbosacral region: Secondary | ICD-10-CM

## 2012-03-21 DIAGNOSIS — M51379 Other intervertebral disc degeneration, lumbosacral region without mention of lumbar back pain or lower extremity pain: Secondary | ICD-10-CM | POA: Insufficient documentation

## 2012-03-21 MED ORDER — TAPENTADOL HCL 50 MG PO TABS: 50.0000 mg | ORAL_TABLET | Freq: Four times a day (QID) | ORAL | Status: DC | PRN

## 2012-03-21 NOTE — Progress Notes (Signed)
Subjective:    Patient ID: Eric Casey, male    DOB: [birth year], 37 y.o.   MRN: [id number]  HPI The patient complains about chronic low back pain which radiates into his LE bilateral.  The problem has been stable. The patient saw Dr. Venetia Maxon since the last visit reported with MRI. The MRI showed some disc bulging at L3-4 and L4- 5, worse at L4-5. The patient states, that Dr. Venetia Maxon stated, that the buldging of the disc has increased some, but he also stated, that the patient was not a surgical candidate at that time. He and his wife reports that he is more irritable and frustrated because of his pain at his last visit. I referred him to a psychologist to help to deal with this frustration, but WC did not approve.  The patient states, that he feels stuck, he thinks his problem is getting worse. He states, that he would be interested in a second opinion, because he really wants to get his back "fixed", and be able to work again.  Pain Inventory Average Pain 7 Pain Right Now 6 My pain is sharp, stabbing and aching  In the last 24 hours, has pain interfered with the following? General activity 6 Relation with others 6 Enjoyment of life 6 What TIME of day is your pain at its worst? day and evening Sleep (in general) Fair  Pain is worse with: walking, standing and some activites Pain improves with: rest, heat/ice and medication Relief from Meds: 5  Mobility walk without assistance how many minutes can you walk? 60 ability to climb steps?  yes do you drive?  yes Do you have any goals in this area?  no  Function disabled: date disabled 5/13 Do you have any goals in this area?  no  Neuro/Psych tingling spasms  Prior Studies Any changes since last visit?  no  Physicians involved in your care Any changes since last visit?  no   Family History  Problem Relation Age of Onset  . Heart disease Father   . Hypertension Father    History   Social History  . Marital Status:  Married    Spouse Name: N/A    Number of Children: N/A  . Years of Education: N/A   Social History Main Topics  . Smoking status: Former Smoker    Types: Cigarettes    Quit date: 04/15/2006  . Smokeless tobacco: Never Used  . Alcohol Use: No  . Drug Use: No  . Sexually Active: None   Other Topics Concern  . None   Social History Narrative  . None   History reviewed. No pertinent past surgical history. Past Medical History  Diagnosis Date  . High blood pressure   . Back pain, chronic   . Buttock pain    BP 117/80  Pulse 94  Resp 14  Ht 5\' 10"  (1.778 m)  Wt 234 lb (106.142 kg)  BMI 33.58 kg/m2  SpO2 96%     Review of Systems  Musculoskeletal: Positive for back pain.  All other systems reviewed and are negative.       Objective:   Physical Exam Constitutional: He is oriented to person, place, and time. He appears well-developed and well-nourished.  HENT:  Head: Normocephalic.  Eyes: Pupils are equal, round, and reactive to light.  Neck: Normal range of motion.  Neurological: He is alert and oriented to person, place, and time.  Skin: Skin is warm and dry.  Psychiatric: He has a normal mood and  affect.  Symmetric normal motor tone is noted throughout. Normal muscle bulk. Muscle testing reveals 5/5 muscle strength of the upper extremity, and 5/5 of the lower extremity. Full range of motion in upper and lower extremities. ROM of spine is restricted. Fine motor movements are normal in both hands.  Sensory is intact and symmetric to light touch, pinprick and proprioception.  DTR in the upper and lower extremity are present and symmetric 2+. No clonus is noted.  Patient arises from chair without difficulty. Narrow based gait with normal arm swing bilateral , able to walk on heels and toes . Tandem walk is stable. No pronator drift.  Tenderness at level of L4 bilateral.        Assessment & Plan:  This is a 37 year old male with  1.DDD in L-spine  2.Sciatica   3.Buldging discs, L3-4 and L4-5, worse at L4-5  4.LBP, radiating into LE bilateral  Plan :  Patient should continue with his walking program I also showed him some core strengthening exercises which he should do regularly. Also showed him some McKenzie exercises for his buldging discs at his last visit, which he should continue to do.Refilled his Nucynta 50mg  qid, his flector patches, and his celebrex. Also suggested some injections, but the patient does not want to get steroid injections, he has gotten them in the past without success.  The patient states, that he feels stuck, he thinks his problem is getting worse. He states, that he would be interested in a second opinion, because he really wants to get his back "fixed", and be able to work again. It might be a good idea, to get a second opinion, and if this is only to help the patient to deal with his situation, and to deal with his pain. He is exercising regularly, he had injections, but this has not helped with his symptoms. The patient has contacted WC, to get an approval for a second opinion, but they have not responded yet. Follow up in 1 month.

## 2012-03-21 NOTE — Patient Instructions (Signed)
Stay as active as tolerated, continue with your exercise and walking program.

## 2012-03-26 NOTE — Progress Notes (Signed)
Eric Casey is there truly no word from workers comp regarding this patient's second opinion. I think this is the second month that you have charted you are waiting for another opinion

## 2012-04-18 ENCOUNTER — Encounter: Payer: Self-pay | Admitting: Physical Medicine and Rehabilitation

## 2012-04-18 ENCOUNTER — Encounter
Payer: Worker's Compensation | Attending: Physical Medicine and Rehabilitation | Admitting: Physical Medicine and Rehabilitation

## 2012-04-18 VITALS — BP 124/76 | HR 73 | Resp 16 | Ht 70.0 in | Wt 233.0 lb

## 2012-04-18 DIAGNOSIS — M47817 Spondylosis without myelopathy or radiculopathy, lumbosacral region: Secondary | ICD-10-CM

## 2012-04-18 DIAGNOSIS — G8929 Other chronic pain: Secondary | ICD-10-CM | POA: Insufficient documentation

## 2012-04-18 DIAGNOSIS — M545 Low back pain, unspecified: Secondary | ICD-10-CM | POA: Insufficient documentation

## 2012-04-18 DIAGNOSIS — M5430 Sciatica, unspecified side: Secondary | ICD-10-CM | POA: Insufficient documentation

## 2012-04-18 DIAGNOSIS — M5137 Other intervertebral disc degeneration, lumbosacral region: Secondary | ICD-10-CM | POA: Insufficient documentation

## 2012-04-18 DIAGNOSIS — M51379 Other intervertebral disc degeneration, lumbosacral region without mention of lumbar back pain or lower extremity pain: Secondary | ICD-10-CM | POA: Insufficient documentation

## 2012-04-18 MED ORDER — TAPENTADOL HCL 50 MG PO TABS: 50.0000 mg | ORAL_TABLET | Freq: Four times a day (QID) | ORAL | Status: DC | PRN

## 2012-04-18 NOTE — Patient Instructions (Signed)
Continue with your exercise program 

## 2012-04-18 NOTE — Progress Notes (Signed)
Subjective:    Patient ID: Eric Casey, male    DOB: May 12, 1975, 37 y.o.   MRN: [id number]  HPI The patient complains about chronic low back pain which radiates into his LE bilateral.  The problem has been stable. The patient saw Dr. Venetia Maxon since the last visit reported with MRI. The MRI showed some disc bulging at L3-4 and L4- 5, worse at L4-5. The patient states, that Dr. Venetia Maxon stated, that the buldging of the disc has increased some, but he also stated, that the patient was not a surgical candidate at that time. He and his wife reports that he is more irritable and frustrated because of his pain at his last visit. I referred him to a psychologist to help to deal with this frustration, but WC did not approve.  The patient states, that he feels stuck, he thinks his problem is getting worse. He states, that he would be interested in a second opinion, because he really wants to get his back "fixed", and be able to work again.  Pain Inventory Average Pain 6 Pain Right Now 6 My pain is sharp and stabbing  In the last 24 hours, has pain interfered with the following? General activity 7 Relation with others 6 Enjoyment of life 6 What TIME of day is your pain at its worst? day,evening time Sleep (in general) n/a  Pain is worse with: walking, standing and some activites Pain improves with: rest, heat/ice and medication Relief from Meds: n/a  Mobility walk without assistance how many minutes can you walk? 60 min. ability to climb steps?  yes do you drive?  yes Do you have any goals in this area?  no  Function Do you have any goals in this area?  no  Neuro/Psych tingling spasms  Prior Studies Any changes since last visit?  no  Physicians involved in your care Any changes since last visit?  no   Family History  Problem Relation Age of Onset  . Heart disease Father   . Hypertension Father    History   Social History  . Marital Status: Married    Spouse Name: N/A     Number of Children: N/A  . Years of Education: N/A   Social History Main Topics  . Smoking status: Former Smoker    Types: Cigarettes    Quit date: 04/15/2006  . Smokeless tobacco: Never Used  . Alcohol Use: No  . Drug Use: No  . Sexually Active: None   Other Topics Concern  . None   Social History Narrative  . None   History reviewed. No pertinent past surgical history. Past Medical History  Diagnosis Date  . High blood pressure   . Back pain, chronic   . Buttock pain    BP 124/76  Pulse 73  Resp 16  Ht 5\' 10"  (1.778 m)  Wt 233 lb (105.688 kg)  BMI 33.43 kg/m2  SpO2 99%     Review of Systems  Neurological:       Spasms and tingling   All other systems reviewed and are negative.       Objective:   Physical Exam Constitutional: He is oriented to person, place, and time. He appears well-developed and well-nourished.  HENT:  Head: Normocephalic.  Eyes: Pupils are equal, round, and reactive to light.  Neck: Normal range of motion.  Neurological: He is alert and oriented to person, place, and time.  Skin: Skin is warm and dry.  Psychiatric: He has a normal mood  and affect.  Symmetric normal motor tone is noted throughout. Normal muscle bulk. Muscle testing reveals 5/5 muscle strength of the upper extremity, and 5/5 of the lower extremity. Full range of motion in upper and lower extremities. ROM of spine is restricted. Fine motor movements are normal in both hands.  Sensory is intact and symmetric to light touch, pinprick and proprioception.  DTR in the upper and lower extremity are present and symmetric 2+. No clonus is noted.  Patient arises from chair without difficulty. Narrow based gait with normal arm swing bilateral , able to walk on heels and toes . Tandem walk is stable. No pronator drift.  Tenderness at level of L4 bilateral.        Assessment & Plan:  This is a 37 year old male with  1.DDD in L-spine  2.Sciatica  3.Buldging discs, L3-4 and  L4-5, worse at L4-5  4.LBP, radiating into LE bilateral  Plan :  Patient should continue with his walking program I also showed him some core strengthening exercises which he should do regularly. Also showed him some McKenzie exercises for his buldging discs at his last visit, which he should continue to do.Refilled his Nucynta 50mg  qid, his flector patches, and his celebrex. Also suggested some injections, but the patient does not want to get steroid injections, he has gotten them in the past without success.  The patient states, that he feels stuck, he thinks his problem is getting worse. He states, that he would be interested in a second opinion, because he really wants to get his back "fixed", and be able to work again. It might be a good idea, to get a second opinion, and if this is only to help the patient to deal with his situation, and to deal with his pain. He is exercising regularly, he had injections, but this has not helped with his symptoms. The patient has contacted WC, to get an approval for a second opinion, but they have not responded yet. WC still has not responded to his calls. Follow up in 1 month.

## 2012-05-23 ENCOUNTER — Encounter
Payer: Worker's Compensation | Attending: Physical Medicine and Rehabilitation | Admitting: Physical Medicine and Rehabilitation

## 2012-05-23 ENCOUNTER — Encounter: Payer: Self-pay | Admitting: Physical Medicine and Rehabilitation

## 2012-05-23 VITALS — BP 103/64 | HR 72 | Resp 14 | Ht 70.0 in | Wt 233.0 lb

## 2012-05-23 DIAGNOSIS — M5430 Sciatica, unspecified side: Secondary | ICD-10-CM | POA: Insufficient documentation

## 2012-05-23 DIAGNOSIS — G8929 Other chronic pain: Secondary | ICD-10-CM | POA: Insufficient documentation

## 2012-05-23 DIAGNOSIS — M51379 Other intervertebral disc degeneration, lumbosacral region without mention of lumbar back pain or lower extremity pain: Secondary | ICD-10-CM | POA: Insufficient documentation

## 2012-05-23 DIAGNOSIS — M5137 Other intervertebral disc degeneration, lumbosacral region: Secondary | ICD-10-CM | POA: Insufficient documentation

## 2012-05-23 DIAGNOSIS — M545 Low back pain, unspecified: Secondary | ICD-10-CM | POA: Insufficient documentation

## 2012-05-23 DIAGNOSIS — M47817 Spondylosis without myelopathy or radiculopathy, lumbosacral region: Secondary | ICD-10-CM

## 2012-05-23 MED ORDER — TAPENTADOL HCL 50 MG PO TABS: 50.0000 mg | ORAL_TABLET | Freq: Four times a day (QID) | ORAL | Status: DC | PRN

## 2012-05-23 NOTE — Progress Notes (Signed)
Subjective:    Patient ID: Eric Casey, male    DOB: [birth year], 37 y.o.   MRN: [id number]  HPI The patient complains about chronic low back pain which radiates into his LE bilateral.  The problem has been stable. The patient saw Dr. Venetia Maxon since the last visit reported with MRI. The MRI showed some disc bulging at L3-4 and L4- 5, worse at L4-5. The patient states, that Dr. Venetia Maxon stated, that the buldging of the disc has increased some, but he also stated, that the patient was not a surgical candidate at that time. He and his wife reports that he is more irritable and frustrated because of his pain at his last visit. I referred him to a psychologist to help to deal with this frustration, but WC did not approve.  The patient states, that he feels stuck, he thinks his problem is getting worse. He states, that he would be interested in a second opinion, because he really wants to get his back "fixed", and be able to work again.  Pain Inventory Average Pain 6 Pain Right Now 7 My pain is constant, sharp, burning, stabbing and aching  In the last 24 hours, has pain interfered with the following? General activity 6 Relation with others 6 Enjoyment of life 6 What TIME of day is your pain at its worst? evening Sleep (in general) Fair  Pain is worse with: walking, bending and some activites Pain improves with: rest, heat/ice and medication Relief from Meds: 6  Mobility walk without assistance how many minutes can you walk? 60 ability to climb steps?  yes do you drive?  yes Do you have any goals in this area?  no  Function not employed: date last employed 05/13 Do you have any goals in this area?  no  Neuro/Psych numbness spasms  Prior Studies Any changes since last visit?  no  Physicians involved in your care Any changes since last visit?  no   Family History  Problem Relation Age of Onset  . Heart disease Father   . Hypertension Father    History   Social History   . Marital Status: Married    Spouse Name: N/A    Number of Children: N/A  . Years of Education: N/A   Social History Main Topics  . Smoking status: Former Smoker    Types: Cigarettes    Quit date: 04/15/2006  . Smokeless tobacco: Never Used  . Alcohol Use: No  . Drug Use: No  . Sexually Active: None   Other Topics Concern  . None   Social History Narrative  . None   History reviewed. No pertinent past surgical history. Past Medical History  Diagnosis Date  . High blood pressure   . Back pain, chronic   . Buttock pain    BP 103/64  Pulse 72  Resp 14  Ht 5\' 10"  (1.778 m)  Wt 233 lb (105.688 kg)  BMI 33.43 kg/m2  SpO2 97%     Review of Systems  Musculoskeletal: Positive for back pain.  Neurological: Positive for numbness.  All other systems reviewed and are negative.       Objective:   Physical Exam Constitutional: He is oriented to person, place, and time. He appears well-developed and well-nourished.  HENT:  Head: Normocephalic.  Eyes: Pupils are equal, round, and reactive to light.  Neck: Normal range of motion.  Neurological: He is alert and oriented to person, place, and time.  Skin: Skin is warm and dry.  Psychiatric: He has a normal mood and affect.  Symmetric normal motor tone is noted throughout. Normal muscle bulk. Muscle testing reveals 5/5 muscle strength of the upper extremity, and 5/5 of the lower extremity. Full range of motion in upper and lower extremities. ROM of spine is restricted. Fine motor movements are normal in both hands.  Sensory is intact and symmetric to light touch, pinprick and proprioception.  DTR in the upper and lower extremity are present and symmetric 2+. No clonus is noted.  Patient arises from chair without difficulty. Narrow based gait with normal arm swing bilateral , able to walk on heels and toes . Tandem walk is stable. No pronator drift.  Tenderness at level of L4 bilateral.        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 Also showed him some McKenzie exercises for his buldging discs at his last visit, which he should continue to do.Refilled his Nucynta 50mg  qid. Also suggested some injections, but the patient does not want to get steroid injections, he has gotten them in the past without success.  The patient states, that he feels stuck, he thinks his problem is getting worse. He states, that he would be interested in a second opinion, because he really wants to get his back "fixed", and be able to work again. It might be a good idea, to get a second opinion, and if this is only to help the patient to deal with his situation, and to deal with his pain. He is exercising regularly, he had injections, but this has not helped with his symptoms. The patient has contacted WC, to get an approval for a second opinion, but they have not responded yet. WC still has not responded to his calls.  Follow up in 1 month.

## 2012-05-23 NOTE — Patient Instructions (Signed)
Continue with your exercise program in the pool and on land.

## 2012-06-25 ENCOUNTER — Encounter
Payer: Worker's Compensation | Attending: Physical Medicine and Rehabilitation | Admitting: Physical Medicine and Rehabilitation

## 2012-06-25 ENCOUNTER — Encounter: Payer: Self-pay | Admitting: Physical Medicine and Rehabilitation

## 2012-06-25 VITALS — BP 124/73 | HR 69 | Resp 14 | Ht 70.0 in | Wt 235.0 lb

## 2012-06-25 DIAGNOSIS — M5430 Sciatica, unspecified side: Secondary | ICD-10-CM | POA: Insufficient documentation

## 2012-06-25 DIAGNOSIS — Z5181 Encounter for therapeutic drug level monitoring: Secondary | ICD-10-CM

## 2012-06-25 DIAGNOSIS — M47817 Spondylosis without myelopathy or radiculopathy, lumbosacral region: Secondary | ICD-10-CM

## 2012-06-25 DIAGNOSIS — M51379 Other intervertebral disc degeneration, lumbosacral region without mention of lumbar back pain or lower extremity pain: Secondary | ICD-10-CM | POA: Insufficient documentation

## 2012-06-25 DIAGNOSIS — M79609 Pain in unspecified limb: Secondary | ICD-10-CM | POA: Insufficient documentation

## 2012-06-25 DIAGNOSIS — M545 Low back pain, unspecified: Secondary | ICD-10-CM | POA: Insufficient documentation

## 2012-06-25 DIAGNOSIS — G8929 Other chronic pain: Secondary | ICD-10-CM | POA: Insufficient documentation

## 2012-06-25 DIAGNOSIS — M5137 Other intervertebral disc degeneration, lumbosacral region: Secondary | ICD-10-CM | POA: Insufficient documentation

## 2012-06-25 DIAGNOSIS — Z79899 Other long term (current) drug therapy: Secondary | ICD-10-CM

## 2012-06-25 MED ORDER — TAPENTADOL HCL 50 MG PO TABS: 50.0000 mg | ORAL_TABLET | Freq: Four times a day (QID) | ORAL | Status: DC | PRN

## 2012-06-25 NOTE — Progress Notes (Signed)
Subjective:    Patient ID: Eric Casey, male    DOB: [birth year], 37 y.o.   MRN: [id number]  HPI The patient complains about chronic low back pain which radiates into his LE bilateral.  The problem has been stable. The patient saw Eric Casey since the last visit reported with MRI. The MRI showed some disc bulging at L3-4 and L4- 5, worse at L4-5. The patient states, that Eric Casey stated, that the buldging of the disc has increased some, but he also stated, that the patient was not a surgical candidate at that time. He and his wife reports that he is more irritable and frustrated because of his pain at his last visit. I referred him to a psychologist to help to deal with this frustration, but WC did not approve.  The patient states, that he feels stuck, he thinks his problem is getting worse. He states, that he would be interested in a second opinion, because he really wants to get his back "fixed", and be able to work again. WC did approve a second opinion, the patient will see Eric Casey on 06/27/12.  Pain Inventory Average Pain 6 Pain Right Now 7 My pain is sharp, stabbing and aching  In the last 24 hours, has pain interfered with the following? General activity 6 Relation with others 6 Enjoyment of life 7 What TIME of day is your pain at its worst? day and evening Sleep (in general) Fair  Pain is worse with: walking, standing and some activites Pain improves with: rest, heat/ice and medication Relief from Meds: 3  Mobility walk without assistance how many minutes can you walk? 60 ability to climb steps?  yes do you drive?  yes Do you have any goals in this area?  no  Function not employed: date last employed 05/2011  Neuro/Psych tingling  Prior Studies Any changes since last visit?  no  Physicians involved in your care Any changes since last visit?  no   Family History  Problem Relation Age of Onset  . Heart disease Father   . Hypertension Father     History   Social History  . Marital Status: Married    Spouse Name: N/A    Number of Children: N/A  . Years of Education: N/A   Social History Main Topics  . Smoking status: Former Smoker    Types: Cigarettes    Quit date: 04/15/2006  . Smokeless tobacco: Never Used  . Alcohol Use: No  . Drug Use: No  . Sexually Active: None   Other Topics Concern  . None   Social History Narrative  . None   History reviewed. No pertinent past surgical history. Past Medical History  Diagnosis Date  . High blood pressure   . Back pain, chronic   . Buttock pain    BP 124/73  Pulse 69  Resp 14  Ht 5\' 10"  (1.778 m)  Wt 235 lb (106.595 kg)  BMI 33.72 kg/m2  SpO2 98%     Review of Systems  All other systems reviewed and are negative.       Objective:   Physical Exam Constitutional: He is oriented to person, place, and time. He appears well-developed and well-nourished.  HENT:  Head: Normocephalic.  Eyes: Pupils are equal, round, and reactive to light.  Neck: Normal range of motion.  Neurological: He is alert and oriented to person, place, and time.  Skin: Skin is warm and dry.  Psychiatric: He has a normal mood and affect.  Symmetric normal motor tone is noted throughout. Normal muscle bulk. Muscle testing reveals 5/5 muscle strength of the upper extremity, and 5/5 of the lower extremity. Full range of motion in upper and lower extremities. ROM of spine is restricted. Fine motor movements are normal in both hands.  Sensory is intact and symmetric to light touch, pinprick and proprioception.  DTR in the upper and lower extremity are present and symmetric 2+. No clonus is noted.  Patient arises from chair without difficulty. Narrow based gait with normal arm swing bilateral , able to walk on heels and toes . Tandem walk is stable. No pronator drift.  Tenderness at level of L4 bilateral.        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 Also showed him some McKenzie exercises for his buldging discs at his last visit, which he should continue to do.Refilled his Nucynta 50mg  qid. Also suggested some injections, but the patient does not want to get steroid injections, he has gotten them in the past without success.  The patient states, that he feels stuck, he thinks his problem is getting worse. He states, that he would be interested in a second opinion, because he really wants to get his back "fixed", and be able to work again. It might be a good idea, to get a second opinion, and if this is only to help the patient to deal with his situation, and to deal with his pain. He is exercising regularly, he had injections, but this has not helped with his symptoms.  WC has now approved a second opinion, he will see Eric Casey on 06/27/12.  If the patient will not have any surgery done, he will start aquatic therapy, which I think is more beneficial and putting less stress on his spine, I ordered aquatic PT, in case. Also might revisit pain psychologist if surgery is not approved, and some anti anxiety medication to deal with his agrevation when he is in pain. The plan was discussed with his NCM [facility], who was present and agreed to the plan.  Follow up in 1 month.

## 2012-06-25 NOTE — Patient Instructions (Signed)
Try to stay as active as tolerated 

## 2012-06-25 NOTE — Addendum Note (Signed)
Addended by: Judd Gaudier on: 06/25/2012 02:18 PM   Modules accepted: Orders

## 2012-07-02 ENCOUNTER — Telehealth: Payer: Self-pay

## 2012-07-02 NOTE — Telephone Encounter (Signed)
Spoke with patient about urine drug screen having temazepam and diazepam in it. Patient says he has a script for diazepam and temazepam that he says he has discussed with [place] before.

## 2012-07-03 ENCOUNTER — Telehealth: Payer: Self-pay | Admitting: Physical Medicine and Rehabilitation

## 2012-07-03 NOTE — Telephone Encounter (Signed)
You can add the TENS unit, the patient can get the order for the brace from Dr. Jillyn Hidden,

## 2012-07-05 ENCOUNTER — Telehealth: Payer: Self-pay

## 2012-07-05 MED ORDER — CELECOXIB 200 MG PO CAPS: 200.0000 mg | ORAL_CAPSULE | Freq: Every day | ORAL | Status: DC

## 2012-07-05 NOTE — Telephone Encounter (Signed)
Celebrex refill request, please advise.

## 2012-07-05 NOTE — Telephone Encounter (Signed)
Can be refilled. 

## 2012-07-05 NOTE — Telephone Encounter (Signed)
Celebrex sent to pharmacy electronically

## 2012-07-25 ENCOUNTER — Encounter
Payer: Worker's Compensation | Attending: Physical Medicine and Rehabilitation | Admitting: Physical Medicine and Rehabilitation

## 2012-07-25 ENCOUNTER — Encounter: Payer: Self-pay | Admitting: Physical Medicine and Rehabilitation

## 2012-07-25 VITALS — BP 127/72 | HR 83 | Resp 14 | Ht 70.0 in | Wt 230.0 lb

## 2012-07-25 DIAGNOSIS — M47817 Spondylosis without myelopathy or radiculopathy, lumbosacral region: Secondary | ICD-10-CM

## 2012-07-25 DIAGNOSIS — M5137 Other intervertebral disc degeneration, lumbosacral region: Secondary | ICD-10-CM | POA: Insufficient documentation

## 2012-07-25 DIAGNOSIS — M51379 Other intervertebral disc degeneration, lumbosacral region without mention of lumbar back pain or lower extremity pain: Secondary | ICD-10-CM | POA: Insufficient documentation

## 2012-07-25 DIAGNOSIS — M5430 Sciatica, unspecified side: Secondary | ICD-10-CM | POA: Insufficient documentation

## 2012-07-25 DIAGNOSIS — G8929 Other chronic pain: Secondary | ICD-10-CM | POA: Insufficient documentation

## 2012-07-25 MED ORDER — TAPENTADOL HCL 50 MG PO TABS: 50.0000 mg | ORAL_TABLET | Freq: Four times a day (QID) | ORAL | Status: DC | PRN

## 2012-07-25 NOTE — Progress Notes (Signed)
Subjective:    Patient ID: Eric Casey, male    DOB: 1975/01/10, 37 y.o.   MRN: [id number]  HPI The patient complains about chronic low back pain which radiates into his LE bilateral.  The problem has been stable. The patient saw Dr. Venetia Maxon since the last visit reported with MRI. The MRI showed some disc bulging at L3-4 and L4- 5, worse at L4-5. The patient states, that Dr. Venetia Maxon stated, that the buldging of the disc has increased some, but he also stated, that the patient was not a surgical candidate at that time. He and his wife reports that he is more irritable and frustrated because of his pain at his last visit. I referred him to a psychologist to help to deal with this frustration, but WC did not approve.  The patient states, that he feels stuck, he thinks his problem is getting worse. He states, that he would be interested in a second opinion, because he really wants to get his back "fixed", and be able to work again.  WC did approve a second opinion, the patient saw Dr. Jillyn Hidden on 06/27/12, who told the patient, that a PSF at L4-5 would be possible, but he would not like to do it at this point, because the adjacent levels have already degenerative changes, and would not be able to compensate well after a fusion of L4-5. The patient also states, that he is very irritable, because of his chronic pain, and that he has taken Valium in the past, which has helped with that and also has helped with his muscle spasms. He states, that he does not tolerate the Flexeril well, it knockes him out. Pain Inventory Average Pain 6 Pain Right Now 7 My pain is sharp, burning and aching  In the last 24 hours, has pain interfered with the following? General activity 8 Relation with others 8 Enjoyment of life 8 What TIME of day is your pain at its worst? varies Sleep (in general) Fair  Pain is worse with: walking, standing and some activites Pain improves with: rest, heat/ice and medication Relief  from Meds: 6  Mobility walk without assistance ability to climb steps?  yes do you drive?  yes Do you have any goals in this area?  no  Function Do you have any goals in this area?  no  Neuro/Psych No problems in this area  Prior Studies Any changes since last visit?  no  Physicians involved in your care Any changes since last visit?  no   Family History  Problem Relation Age of Onset  . Heart disease Father   . Hypertension Father    History   Social History  . Marital Status: Married    Spouse Name: N/A    Number of Children: N/A  . Years of Education: N/A   Social History Main Topics  . Smoking status: Former Smoker    Types: Cigarettes    Quit date: 04/15/2006  . Smokeless tobacco: Never Used  . Alcohol Use: No  . Drug Use: No  . Sexually Active: None   Other Topics Concern  . None   Social History Narrative  . None   History reviewed. No pertinent past surgical history. Past Medical History  Diagnosis Date  . High blood pressure   . Back pain, chronic   . Buttock pain    BP 127/72  Pulse 83  Resp 14  Ht 5\' 10"  (1.778 m)  Wt 230 lb (104.327 kg)  BMI 33 kg/m2  SpO2 97%     Review of Systems  Musculoskeletal: Positive for back pain.  All other systems reviewed and are negative.       Objective:   Physical Exam Constitutional: He is oriented to person, place, and time. He appears well-developed and well-nourished.  HENT:  Head: Normocephalic.  Eyes: Pupils are equal, round, and reactive to light.  Neck: Normal range of motion.  Neurological: He is alert and oriented to person, place, and time.  Skin: Skin is warm and dry.  Psychiatric: He has a normal mood and affect.  Symmetric normal motor tone is noted throughout. Normal muscle bulk. Muscle testing reveals 5/5 muscle strength of the upper extremity, and 5/5 of the lower extremity. Full range of motion in upper and lower extremities. ROM of spine is restricted. Fine motor  movements are normal in both hands.  Sensory is intact and symmetric to light touch, pinprick and proprioception.  DTR in the upper and lower extremity are present and symmetric 2+. No clonus is noted.  Patient arises from chair without difficulty. Narrow based gait with normal arm swing bilateral , able to walk on heels and toes . Tandem walk is stable. No pronator drift.  Tenderness at level of L4 bilateral.        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 Also showed him some McKenzie exercises for his buldging discs at his last visit, which he should continue to do.Refilled his Nucynta 50mg  qid. Also suggested some injections, but the patient does not want to get steroid injections, he has gotten them in the past without success.  WC did approve a second opinion, the patient saw Dr. Jillyn Hidden on 06/27/12, who told the patient, that a PSF at L4-5 would be possible, but he would not like to do it at this point, because the adjacent levels have already degenerative changes, and would not be able to compensate well after a fusion of L4-5. The patient also states, that he is very irritable, because of his chronic pain, and that he has taken Valium in the past, which has helped with that and also has helped with his muscle spasms. He states, that he does not tolerate the Flexeril well, it knockes him out.I will talk to Dr. Doroteo Bradford about this, if he does not want [place] to prescribe the Valium, I will prescribe Robaxin for the patient's muscle spasms. WC approved aquatic therapy, which I think is more beneficial and putting less stress on his spine.  Also might revisit pain psychologist if surgery is not approved, and some anti anxiety medication  to deal with his agrevation when he is in pain. The plan was discussed with his NCM [facility], who was present and agreed to the plan.  Follow up in 1 month.

## 2012-07-25 NOTE — Patient Instructions (Signed)
Continue with your exercise program 

## 2012-07-27 ENCOUNTER — Telehealth: Payer: Self-pay

## 2012-07-27 NOTE — Telephone Encounter (Signed)
Talked to Dr. Doroteo Bradford, he does not want to prescribe the Valium

## 2012-07-27 NOTE — Telephone Encounter (Signed)
I will talk to Dr. Doroteo Bradford, the patient was here Wednesday, when Dr. Doroteo Bradford was not here, and as you know we were very busy yesterday , and Dr. Doroteo Bradford left around 2, will talk to him asap

## 2012-07-27 NOTE — Telephone Encounter (Signed)
Patient called regarding a questions Eric Casey was supposed to discuss with Dr Wynn Banker about medication.  He would like a call back as soon as possible.

## 2012-07-30 ENCOUNTER — Telehealth: Payer: Self-pay

## 2012-07-30 DIAGNOSIS — M47817 Spondylosis without myelopathy or radiculopathy, lumbosacral region: Secondary | ICD-10-CM

## 2012-07-30 NOTE — Telephone Encounter (Signed)
Case manager needs last office note as well as order for pool therapy and a tens unit.  This was faxed to [phone].

## 2012-08-01 ENCOUNTER — Telehealth: Payer: Self-pay | Admitting: Physical Medicine & Rehabilitation

## 2012-08-01 MED ORDER — METHOCARBAMOL 500 MG PO TABS: 500.0000 mg | ORAL_TABLET | Freq: Three times a day (TID) | ORAL | Status: DC

## 2012-08-01 NOTE — Telephone Encounter (Signed)
Please order Robaxin 500mg  , tid, prn muscle spasms, # 90

## 2012-08-01 NOTE — Telephone Encounter (Signed)
Notified Jmarion that medication order sent.

## 2012-08-01 NOTE — Telephone Encounter (Signed)
Please advise about the muscle relaxer.

## 2012-08-01 NOTE — Telephone Encounter (Signed)
Spoke with patient this morning.  Was very upset about a message he left last week, requesting a muscle relaxer or valium that he had discussed with Clydie Braun at visit.  I did see a message that Dr. Wynn Banker did not want to prescribe the valium.  He asked about another muscle relaxer that Clydie Braun had discussed with him, but could not remember the name.  I could not advise him on that, since it was not mentioned in any of the telephone messages.  Patient was particularly upset that no one called him back, despite leaving a message last week.  He states that this happens "evey time" he calls in for something.  I told him that I could send another message back regarding a different muscle relaxer, but he declined and said he would discuss it with MD at visit in August.

## 2012-08-07 ENCOUNTER — Ambulatory Visit: Payer: Self-pay | Admitting: Physical Therapy

## 2012-08-28 ENCOUNTER — Encounter: Payer: Self-pay | Admitting: Physical Medicine & Rehabilitation

## 2012-08-28 ENCOUNTER — Encounter: Payer: Worker's Compensation | Attending: Physical Medicine & Rehabilitation

## 2012-08-28 ENCOUNTER — Ambulatory Visit (HOSPITAL_BASED_OUTPATIENT_CLINIC_OR_DEPARTMENT_OTHER): Payer: Worker's Compensation | Admitting: Physical Medicine & Rehabilitation

## 2012-08-28 VITALS — BP 144/74 | HR 86 | Resp 16 | Ht 70.0 in | Wt 231.0 lb

## 2012-08-28 DIAGNOSIS — IMO0002 Reserved for concepts with insufficient information to code with codable children: Secondary | ICD-10-CM | POA: Insufficient documentation

## 2012-08-28 DIAGNOSIS — G894 Chronic pain syndrome: Secondary | ICD-10-CM | POA: Insufficient documentation

## 2012-08-28 DIAGNOSIS — M5432 Sciatica, left side: Secondary | ICD-10-CM

## 2012-08-28 DIAGNOSIS — M5137 Other intervertebral disc degeneration, lumbosacral region: Secondary | ICD-10-CM

## 2012-08-28 DIAGNOSIS — M5430 Sciatica, unspecified side: Secondary | ICD-10-CM

## 2012-08-28 DIAGNOSIS — M62838 Other muscle spasm: Secondary | ICD-10-CM | POA: Insufficient documentation

## 2012-08-28 DIAGNOSIS — M51379 Other intervertebral disc degeneration, lumbosacral region without mention of lumbar back pain or lower extremity pain: Secondary | ICD-10-CM | POA: Insufficient documentation

## 2012-08-28 DIAGNOSIS — F411 Generalized anxiety disorder: Secondary | ICD-10-CM | POA: Insufficient documentation

## 2012-08-28 MED ORDER — TIZANIDINE HCL 2 MG PO TABS: 2.0000 mg | ORAL_TABLET | Freq: Three times a day (TID) | ORAL | Status: DC | PRN

## 2012-08-28 MED ORDER — TAPENTADOL HCL 50 MG PO TABS: 50.0000 mg | ORAL_TABLET | Freq: Four times a day (QID) | ORAL | Status: DC | PRN

## 2012-08-28 NOTE — Progress Notes (Signed)
Subjective:    Patient ID: Eric Casey, male    DOB: 1975-04-17, 37 y.o.   MRN: [id number]  HPI Seen by Dr Jillyn Hidden Orthopedic surgeon Felt surgery will be needed in future Started aquatic therapy yesterday C/o anxiety starting pain psych tomorrow  Nucynta 50mg  QID Flexeril  Sleepiness Robaxin also causes mental status Family MD tried cymbalta and maybe Lexapro  Has episodes of muscle spasms associated with anxiety about twice a week.  Pain Inventory Average Pain 6 Pain Right Now 6 My pain is sharp, burning and aching  In the last 24 hours, has pain interfered with the following? General activity 6 Relation with others 7 Enjoyment of life 6 What TIME of day is your pain at its worst? evening Sleep (in general) Fair  Pain is worse with: bending, standing and some activites Pain improves with: rest, heat/ice and medication Relief from Meds: 6  Mobility walk without assistance how many minutes can you walk? 60 ability to climb steps?  yes do you drive?  yes Do you have any goals in this area?  no  Function Do you have any goals in this area?  no  Neuro/Psych No problems in this area  Prior Studies Any changes since last visit?  no  Physicians involved in your care Any changes since last visit?  no   Family History  Problem Relation Age of Onset  . Heart disease Father   . Hypertension Father    History   Social History  . Marital Status: Married    Spouse Name: N/A    Number of Children: N/A  . Years of Education: N/A   Social History Main Topics  . Smoking status: Former Smoker    Types: Cigarettes    Quit date: 04/15/2006  . Smokeless tobacco: Never Used  . Alcohol Use: No  . Drug Use: No  . Sexual Activity: None   Other Topics Concern  . None   Social History Narrative  . None   History reviewed. No pertinent past surgical history. Past Medical History  Diagnosis Date  . High blood pressure   . Back pain, chronic   . Buttock  pain    BP 144/74  Pulse 86  Resp 16  Ht 5\' 10"  (1.778 m)  Wt 231 lb (104.781 kg)  BMI 33.15 kg/m2  SpO2 95%     Review of Systems  All other systems reviewed and are negative.       Objective:   Physical Exam  Musculoskeletal:  Lumbar spine has reduced flexion and extension Pain is in the sacral area with bending and extension. Radiates into the buttocks.  Neurological: He has normal strength. He exhibits normal muscle tone.  Decreased sensation left L5 and S1, partial decreased sensation left L3-L4  Strength is normal in bilateral hip flexors knee extensors ankle dorsiflexors and plantar flexors   Ambulates without evidence of toe drag or knee instability Mood and affect are without lability or agitation       Assessment & Plan:  1. 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 Patient has adjacent level degenerative changes at L3-4 and L5-S1.   Second opinion surgery consult felt that 3 level fusion will likely be necessary some years in the future  Continue Nucynta 50 mg 4 times a day I discussed that the patient's family physician may not have been aware that Cymbalta may interact with Nucynta causing seratonin syndrome  2. Chronic pain syndrome with anxiety that triggers  muscle spasms. Has not responded well to Robaxin and Flexeril secondary to sedation. Patient would like to try Valium but I explained that this has a negative potential interaction with Nucynta. Including higher risk of overdose In addition it may be habit forming. He states that these episodes are quite infrequent about twice a week. We discussed a trial of Zanaflex We discussed trying pain psychology  We also discussed that if he had another Dr. prescribe  anxiety medications we would need to know about this.  Return to clinic one month.  Long discussion about treatment options with the patient, his wife as well as Eric Casey medical case  manager Another recommendation by orthopedic spine surgeon included lumbar orthosis to be worn during physical labor. At this point this would only include some yard work. Patient has not returned to work. I have written prescription for a lumbar orthosis  Cc Eric Casey medical case manager [address], [place] [address]

## 2012-08-28 NOTE — Patient Instructions (Addendum)
Recommend pain psychology, recommend Zanaflex 2 mg po q8 hours Re evaluate in one month Lumbar orthosis only to be used during physical labor Continue Nucynta 50 mg 4 times per day If other physicians prescribed controls substance such as  anxiety medication, we would need to know dose and frequency Keep in mind that there is a possibility of overdose when combining Nucynta with Valium

## 2012-09-24 ENCOUNTER — Encounter: Payer: Worker's Compensation | Attending: Physical Medicine & Rehabilitation

## 2012-09-24 ENCOUNTER — Ambulatory Visit (HOSPITAL_BASED_OUTPATIENT_CLINIC_OR_DEPARTMENT_OTHER): Payer: Worker's Compensation | Admitting: Physical Medicine & Rehabilitation

## 2012-09-24 ENCOUNTER — Encounter: Payer: Self-pay | Admitting: Physical Medicine & Rehabilitation

## 2012-09-24 VITALS — BP 125/61 | HR 75 | Resp 14 | Ht 70.0 in | Wt 232.0 lb

## 2012-09-24 DIAGNOSIS — F411 Generalized anxiety disorder: Secondary | ICD-10-CM | POA: Insufficient documentation

## 2012-09-24 DIAGNOSIS — M5137 Other intervertebral disc degeneration, lumbosacral region: Secondary | ICD-10-CM

## 2012-09-24 DIAGNOSIS — M51379 Other intervertebral disc degeneration, lumbosacral region without mention of lumbar back pain or lower extremity pain: Secondary | ICD-10-CM | POA: Insufficient documentation

## 2012-09-24 DIAGNOSIS — IMO0002 Reserved for concepts with insufficient information to code with codable children: Secondary | ICD-10-CM | POA: Insufficient documentation

## 2012-09-24 DIAGNOSIS — G894 Chronic pain syndrome: Secondary | ICD-10-CM | POA: Insufficient documentation

## 2012-09-24 MED ORDER — TAPENTADOL HCL 50 MG PO TABS
50.0000 mg | ORAL_TABLET | Freq: Four times a day (QID) | ORAL | Status: DC | PRN
Start: 2012-09-24 — End: 2012-10-29

## 2012-09-24 MED ORDER — CELECOXIB 200 MG PO CAPS: 200.0000 mg | ORAL_CAPSULE | Freq: Every day | ORAL | Status: DC

## 2012-09-24 NOTE — Progress Notes (Signed)
Subjective:    Patient ID: Eric Casey, male    DOB: September 30, 1975, 37 y.o.   MRN: [id number]  HPI  Seen by Dr Jillyn Hidden Orthopedic surgeon  Felt surgery will be needed in future  Started aquatic therapy yesterday  C/o anxiety starting pain psych tomorrow  Nucynta 50mg  QID  Flexeril Sleepiness  Robaxin also causes mental status  In general pain levels  Have increased 6 on average to 7 8 PT visits  Pain Inventory Average Pain 6 Pain Right Now 7 My pain is sharp and stabbing  In the last 24 hours, has pain interfered with the following? General activity 6 Relation with others 7 Enjoyment of life 8 What TIME of day is your pain at its worst? daytime, evening Sleep (in general) Fair  Pain is worse with: walking and standing Pain improves with: rest, heat/ice and TENS Relief from Meds: 6  Mobility walk without assistance how many minutes can you walk? 60 ability to climb steps?  yes do you drive?  yes Do you have any goals in this area?  no  Function not employed: date last employed 05/05/2011 Do you have any goals in this area?  no  Neuro/Psych No problems in this area  Prior Studies Any changes since last visit?  no  Physicians involved in your care Any changes since last visit?  no   Family History  Problem Relation Age of Onset  . Heart disease Father   . Hypertension Father    History   Social History  . Marital Status: Married    Spouse Name: N/A    Number of Children: N/A  . Years of Education: N/A   Social History Main Topics  . Smoking status: Former Smoker    Types: Cigarettes    Quit date: 04/15/2006  . Smokeless tobacco: Never Used  . Alcohol Use: No  . Drug Use: No  . Sexual Activity: None   Other Topics Concern  . None   Social History Narrative  . None   History reviewed. No pertinent past surgical history. Past Medical History  Diagnosis Date  . High blood pressure   . Back pain, chronic   . Buttock pain    BP 125/61   Pulse 75  Resp 14  Ht 5\' 10"  (1.778 m)  Wt 232 lb (105.235 kg)  BMI 33.29 kg/m2  SpO2 98%     Review of Systems  All other systems reviewed and are negative.       Objective:   Physical Exam  Constitutional: He is oriented to person, place, and time.  Neurological: He is alert and oriented to person, place, and time. He displays no atrophy. He exhibits normal muscle tone. Coordination and gait normal.  Reflex Scores:      Patellar reflexes are 2+ on the right side and 2+ on the left side.      Achilles reflexes are 2+ on the right side and 2+ on the left side.   Musculoskeletal:  Lumbar spine has reduced flexion and extension Pain is in the sacral area with bending and extension. Radiates into the buttocks.  Neurological: He has normal strength. He exhibits normal muscle tone.  Decreased sensation left L5 S1, partial decreased sensation left L3-L4  Strength is normal in bilateral hip flexors knee extensors ankle dorsiflexors and plantar flexors   Ambulates without evidence of toe drag or knee instability  Mood and affect are without lability or agitation       Assessment & Plan:  1. 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 Patient has adjacent level degenerative changes at L3-4 and L5-S1.  Second opinion surgery consult felt that 3 level fusion will likely be necessary some years in the future  Continue Nucynta 50 mg 4 times a day   2. Chronic pain syndrome with anxiety that triggers muscle spasms. Has not responded well to Robaxin and Flexeril secondary to sedation. Patient would like to try Valium but I explained that this has a negative potential interaction with Nucynta. Including higher risk of overdose In addition it may be habit forming. He states that these episodes are quite infrequent about twice a week.  Stop  Zanaflex secondary to nausea Started pain psychology, main focus relaxation techniques  Return to  clinic one month.   discussion about treatment options with the patient, his wife as well as Waynetta Pean medical case manager  Another recommendation by orthopedic spine surgeon included lumbar orthosis to be worn during physical labor. At this point this would only include some yard work. Patient has not returned to work. I have written prescription for a lumbar orthosis -specifically EXOS Cc Waynetta Pean medical case manager  [address], [place] [address]

## 2012-09-24 NOTE — Patient Instructions (Addendum)
Discontinue physical therapy after 1 visit Continue pain psychology EX OS lumbar orthosis  May increase Celebrex 200 mg twice a day for one week then resume once a day  Continue Nucynta 50 mg 4 times per day  Followup PA M.D. visit as needed

## 2012-10-03 ENCOUNTER — Telehealth: Payer: Self-pay

## 2012-10-03 MED ORDER — DICLOFENAC EPOLAMINE 1.3 % TD PTCH: 1.0000 | MEDICATED_PATCH | Freq: Two times a day (BID) | TRANSDERMAL | Status: DC

## 2012-10-03 NOTE — Telephone Encounter (Signed)
Refill for Flector Patches called into [facility])

## 2012-10-03 NOTE — Telephone Encounter (Signed)
[facility] called to get flector patch refill.  Order can be faxed to [phone] or called into [phone].

## 2012-10-15 ENCOUNTER — Encounter: Payer: Worker's Compensation | Admitting: Physical Medicine and Rehabilitation

## 2012-10-29 ENCOUNTER — Encounter
Payer: Worker's Compensation | Attending: Physical Medicine and Rehabilitation | Admitting: Physical Medicine and Rehabilitation

## 2012-10-29 ENCOUNTER — Encounter: Payer: Self-pay | Admitting: Physical Medicine and Rehabilitation

## 2012-10-29 ENCOUNTER — Encounter (INDEPENDENT_AMBULATORY_CARE_PROVIDER_SITE_OTHER): Payer: Self-pay

## 2012-10-29 VITALS — BP 136/77 | HR 84 | Resp 14 | Ht 70.0 in | Wt 233.0 lb

## 2012-10-29 DIAGNOSIS — M545 Low back pain, unspecified: Secondary | ICD-10-CM | POA: Insufficient documentation

## 2012-10-29 DIAGNOSIS — M47817 Spondylosis without myelopathy or radiculopathy, lumbosacral region: Secondary | ICD-10-CM

## 2012-10-29 DIAGNOSIS — M5137 Other intervertebral disc degeneration, lumbosacral region: Secondary | ICD-10-CM | POA: Insufficient documentation

## 2012-10-29 DIAGNOSIS — M51379 Other intervertebral disc degeneration, lumbosacral region without mention of lumbar back pain or lower extremity pain: Secondary | ICD-10-CM | POA: Insufficient documentation

## 2012-10-29 DIAGNOSIS — G8929 Other chronic pain: Secondary | ICD-10-CM | POA: Insufficient documentation

## 2012-10-29 DIAGNOSIS — M5430 Sciatica, unspecified side: Secondary | ICD-10-CM | POA: Insufficient documentation

## 2012-10-29 MED ORDER — TAPENTADOL HCL 50 MG PO TABS: 50.0000 mg | ORAL_TABLET | Freq: Four times a day (QID) | ORAL | Status: DC | PRN

## 2012-10-29 NOTE — Progress Notes (Signed)
Subjective:    Patient ID: Eric Casey, male    DOB: 05/16/1975, 37 y.o.   MRN: [id number]  HPI The patient complains about chronic low back pain which radiates into his LE bilateral.  The problem has been stable. The patient saw Dr. Venetia Maxon, with MRI. The MRI showed some disc bulging at L3-4 and L4- 5, worse at L4-5. The patient states, that Dr. Venetia Maxon stated, that the buldging of the disc has increased some, but he also stated, that the patient was not a surgical candidate at that time.   WC did approve a second opinion, the patient saw Dr. Jillyn Hidden on 06/27/12, who told the patient, that a PSF at L4-5 would be possible, but he would not like to do it at this point, because the adjacent levels have already degenerative changes, and would not be able to compensate well after a fusion of L4-5. The patient went to a psychologist for 4 visits to help to cope with his pain, he states that this has not helped at all, he was given some CDs for meditation, which did not help him. He also reports that he did aquatic therapy for 6 weeks, 2 times per week, which increased his pain, and also did not help at all.  He does not want to try any ESI.  Pain Inventory Average Pain 6 Pain Right Now 7 My pain is stabbing and aching  In the last 24 hours, has pain interfered with the following? General activity 6 Relation with others 6 Enjoyment of life 6 What TIME of day is your pain at its worst? morning and evening Sleep (in general) Fair  Pain is worse with: walking, standing and some activites Pain improves with: rest, heat/ice and medication Relief from Meds: 6  Mobility walk without assistance how many minutes can you walk? 60 ability to climb steps?  yes do you drive?  yes Do you have any goals in this area?  no  Function Do you have any goals in this area?  no  Neuro/Psych No problems in this area  Prior Studies Any changes since last visit?  no  Physicians involved in your  care Any changes since last visit?  no   Family History  Problem Relation Age of Onset  . Heart disease Father   . Hypertension Father    History   Social History  . Marital Status: Married    Spouse Name: N/A    Number of Children: N/A  . Years of Education: N/A   Social History Main Topics  . Smoking status: Former Smoker    Types: Cigarettes    Quit date: 04/15/2006  . Smokeless tobacco: Never Used  . Alcohol Use: No  . Drug Use: No  . Sexual Activity: None   Other Topics Concern  . None   Social History Narrative  . None   History reviewed. No pertinent past surgical history. Past Medical History  Diagnosis Date  . High blood pressure   . Back pain, chronic   . Buttock pain    BP 136/77  Pulse 84  Resp 14  Ht 5\' 10"  (1.778 m)  Wt 233 lb (105.688 kg)  BMI 33.43 kg/m2  SpO2 95%    Review of Systems  Musculoskeletal: Positive for back pain.  All other systems reviewed and are negative.       Objective:   Physical Exam Constitutional: He is oriented to person, place, and time. He appears well-developed and well-nourished.  HENT:  Head:  Normocephalic.  Eyes: Pupils are equal, round, and reactive to light.  Neck: Normal range of motion.  Neurological: He is alert and oriented to person, place, and time.  Skin: Skin is warm and dry.  Psychiatric: He has a normal mood and affect.  Symmetric normal motor tone is noted throughout. Normal muscle bulk. Muscle testing reveals 5/5 muscle strength of the upper extremity, and 5/5 of the lower extremity. Full range of motion in upper and lower extremities. ROM of spine is restricted. Fine motor movements are normal in both hands.  Sensory is intact and symmetric to light touch, pinprick and proprioception.  DTR in the upper and lower extremity are present and symmetric 2+. No clonus is noted.  Patient arises from chair without difficulty. Narrow based gait with normal arm swing bilateral , able to walk on heels  and toes . Tandem walk is stable. No pronator drift.  Tenderness at level of L4 bilateral.        Assessment & Plan:  This is a 37 year old male with  1.DDD in L-spine  2.Sciatica  3.Buldging discs, L3-4 and L4-5, worse at L4-5  4.LBP, radiating into LE bilateral  Plan :  Patient should continue with his walking program and exercise program as pain permits. I also showed him some McKenzie exercises for his buldging discs in the past, which he should continue to do.Refilled his Nucynta 50mg  qid. Also suggested some injections, but the patient does not want to get steroid injections, he has gotten them in the past without success.  WC did approve a second opinion, the patient saw Dr. Jillyn Hidden on 06/27/12, who told the patient, that a PSF at L4-5 would be possible, but he would not like to do it at this point, because the adjacent levels have already degenerative changes, and would not be able to compensate well after a fusion of L4-5.  The patient also states, that he is very irritable, because of his chronic pain, and that he has taken Valium in the past, which has helped with that and also has helped with his muscle spasms. He states, that he does not tolerate the Flexeril and Robaxin well. I talked to Dr. Doroteo Bradford in the past, and we decided that we do not want to prescribe the valium .  The patient went to a psychologist for 4 visits to help to cope with his pain, he states that this has not helped at all, he was given some CDs for meditation, which did not help him. He also reports that he did aquatic therapy for 6 weeks, 2 times per week, which increased his pain, and also did not help at all.  He does not want to try any ESI.   Follow up in 1 month.

## 2012-10-29 NOTE — Patient Instructions (Signed)
Continue with staying as active as tolerated 

## 2012-11-07 ENCOUNTER — Other Ambulatory Visit: Payer: Self-pay

## 2012-11-07 MED ORDER — DICLOFENAC EPOLAMINE 1.3 % TD PTCH: 1.0000 | MEDICATED_PATCH | Freq: Two times a day (BID) | TRANSDERMAL | Status: DC

## 2012-11-08 ENCOUNTER — Other Ambulatory Visit: Payer: Self-pay

## 2012-12-03 ENCOUNTER — Encounter: Payer: Self-pay | Admitting: Physical Medicine and Rehabilitation

## 2012-12-03 ENCOUNTER — Encounter
Payer: Worker's Compensation | Attending: Physical Medicine and Rehabilitation | Admitting: Physical Medicine and Rehabilitation

## 2012-12-03 VITALS — BP 124/72 | HR 76 | Resp 14 | Ht 70.0 in | Wt 235.4 lb

## 2012-12-03 DIAGNOSIS — M47817 Spondylosis without myelopathy or radiculopathy, lumbosacral region: Secondary | ICD-10-CM

## 2012-12-03 DIAGNOSIS — M51379 Other intervertebral disc degeneration, lumbosacral region without mention of lumbar back pain or lower extremity pain: Secondary | ICD-10-CM | POA: Insufficient documentation

## 2012-12-03 DIAGNOSIS — M5137 Other intervertebral disc degeneration, lumbosacral region: Secondary | ICD-10-CM | POA: Insufficient documentation

## 2012-12-03 DIAGNOSIS — Z5181 Encounter for therapeutic drug level monitoring: Secondary | ICD-10-CM

## 2012-12-03 DIAGNOSIS — G8929 Other chronic pain: Secondary | ICD-10-CM | POA: Insufficient documentation

## 2012-12-03 DIAGNOSIS — M5430 Sciatica, unspecified side: Secondary | ICD-10-CM | POA: Insufficient documentation

## 2012-12-03 DIAGNOSIS — M545 Low back pain, unspecified: Secondary | ICD-10-CM | POA: Insufficient documentation

## 2012-12-03 DIAGNOSIS — Z79899 Other long term (current) drug therapy: Secondary | ICD-10-CM

## 2012-12-03 MED ORDER — GABAPENTIN 300 MG PO CAPS: 300.0000 mg | ORAL_CAPSULE | Freq: Every day | ORAL | Status: DC

## 2012-12-03 MED ORDER — TAPENTADOL HCL 50 MG PO TABS: 50.0000 mg | ORAL_TABLET | Freq: Four times a day (QID) | ORAL | Status: DC | PRN

## 2012-12-03 NOTE — Progress Notes (Signed)
Subjective:    Patient ID: Eric Casey, male    DOB: 1975-08-04, 37 y.o.   MRN: [id number]  HPI The patient complains about chronic low back pain which radiates into his LE bilateral.  The problem has been stable. The patient saw Dr. Venetia Maxon, with MRI. The MRI showed some disc bulging at L3-4 and L4- 5, worse at L4-5. The patient states, that Dr. Venetia Maxon stated, that the buldging of the disc has increased some, but he also stated, that the patient was not a surgical candidate at that time.  WC did approve a second opinion, the patient saw Dr. Jillyn Hidden on 06/27/12, who told the patient, that a PSF at L4-5 would be possible, but he would not like to do it at this point, because the adjacent levels have already degenerative changes, and would not be able to compensate well after a fusion of L4-5.  The patient went to a psychologist for 4 visits to help to cope with his pain, he states that this has not helped at all, he was given some CDs for meditation, which did not help him. He also reports that he did aquatic therapy for 6 weeks, 2 times per week, which increased his pain, and also did not help at all.  He complains about increased radiating Sx, mainly tingling, in the L5 distribution on the left, which sometimes keeps him from falling asleep. He does not want to try any ESI.  Pain Inventory Average Pain 6 Pain Right Now 6 My pain is burning, stabbing and aching  In the last 24 hours, has pain interfered with the following? General activity 6 Relation with others 6 Enjoyment of life 6 What TIME of day is your pain at its worst? daytime, night Sleep (in general) Fair  Pain is worse with: walking, standing and some activites Pain improves with: rest, heat/ice and medication Relief from Meds: 5  Mobility walk without assistance how many minutes can you walk? 60 ability to climb steps?  yes do you drive?  yes transfers alone Do you have any goals in this area?  no  Function Do  you have any goals in this area?  no  Neuro/Psych No problems in this area  Prior Studies Any changes since last visit?  no  Physicians involved in your care Any changes since last visit?  no   Family History  Problem Relation Age of Onset  . Heart disease Father   . Hypertension Father    History   Social History  . Marital Status: Married    Spouse Name: N/A    Number of Children: N/A  . Years of Education: N/A   Social History Main Topics  . Smoking status: Former Smoker    Types: Cigarettes    Quit date: 04/15/2006  . Smokeless tobacco: Never Used  . Alcohol Use: No  . Drug Use: No  . Sexual Activity: None   Other Topics Concern  . None   Social History Narrative  . None   History reviewed. No pertinent past surgical history. Past Medical History  Diagnosis Date  . High blood pressure   . Back pain, chronic   . Buttock pain    BP 124/72  Pulse 76  Resp 14  Ht 5\' 10"  (1.778 m)  Wt 235 lb 6.4 oz (106.777 kg)  BMI 33.78 kg/m2  SpO2 97%     Review of Systems  Musculoskeletal: Positive for back pain.  All other systems reviewed and are negative.  Objective:   Physical Exam Constitutional: He is oriented to person, place, and time. He appears well-developed and well-nourished.  HENT:  Head: Normocephalic.  Eyes: Pupils are equal, round, and reactive to light.  Neck: Normal range of motion.  Neurological: He is alert and oriented to person, place, and time.  Skin: Skin is warm and dry.  Psychiatric: He has a normal mood and affect.  Symmetric normal motor tone is noted throughout. Normal muscle bulk. Muscle testing reveals 5/5 muscle strength of the upper extremity, and 5/5 of the lower extremity. Full range of motion in upper and lower extremities. ROM of spine is restricted. Fine motor movements are normal in both hands.  Sensory is intact and symmetric to light touch, pinprick and proprioception.  DTR in the upper and lower extremity  are present and symmetric 2+. No clonus is noted.  Patient arises from chair without difficulty. Narrow based gait with normal arm swing bilateral , able to walk on heels and toes . Tandem walk is stable. No pronator drift.  Tenderness at level of L4 bilateral.        Assessment & Plan:  This is a 37 year old male with  1.DDD in L-spine  2.Sciatica  3.Buldging discs, L3-4 and L4-5, worse at L4-5  4.LBP, radiating into LE bilateral  Plan :  Patient should continue with his walking program and exercise program as pain permits. I also showed him some McKenzie exercises for his buldging discs in the past, which he should continue to do.Refilled his Nucynta 50mg  qid. Also suggested some injections, but the patient does not want to get steroid injections, he has gotten them in the past without success.  WC did approve a second opinion, the patient saw Dr. Jillyn Hidden on 06/27/12, who told the patient, that a PSF at L4-5 would be possible, but he would not like to do it at this point, because the adjacent levels have already degenerative changes, and would not be able to compensate well after a fusion of L4-5.  The patient also states, that he is very irritable, because of his chronic pain, and that he has taken Valium in the past, which has helped with that and also has helped with his muscle spasms. He states, that he does not tolerate the Flexeril and Robaxin well. I talked to Dr. Doroteo Bradford in the past, and we decided that we do not want to prescribe the valium .  The patient went to a psychologist for 4 visits to help to cope with his pain, he states that this has not helped at all, he was given some CDs for meditation, which did not help him. He also reports that he did aquatic therapy for 6 weeks, 2 times per week, which increased his pain, and also did not help at all.  He complains about increased radiating Sx, mainly tingling, in the L5 distribution on the left, which sometimes keeps him from falling  asleep.I prescribed Gabapentin 300mg  , hs, to try for a month, then we might consider increasing the dosing, to bid or tid, if tolerated. Patient will talk to Dr. Doroteo Bradford about this at his next visit. Also educated patient about side effects, and that he could d/c the medication if he does not tolerate it, he is only on one tablet, 300mg , a day, so he does not have to taper it down. He does not want to try any ESI. Follow up in 1 month with Dr. Doroteo Bradford

## 2012-12-03 NOTE — Patient Instructions (Signed)
Continue with your exercise and walking program as pain permits

## 2013-01-08 ENCOUNTER — Encounter: Payer: Self-pay | Admitting: Physical Medicine & Rehabilitation

## 2013-01-08 ENCOUNTER — Ambulatory Visit (HOSPITAL_BASED_OUTPATIENT_CLINIC_OR_DEPARTMENT_OTHER): Payer: Worker's Compensation | Admitting: Physical Medicine & Rehabilitation

## 2013-01-08 ENCOUNTER — Encounter: Payer: Worker's Compensation | Attending: Physical Medicine & Rehabilitation

## 2013-01-08 VITALS — BP 133/70 | HR 82 | Resp 14 | Ht 70.0 in | Wt 239.0 lb

## 2013-01-08 DIAGNOSIS — M5137 Other intervertebral disc degeneration, lumbosacral region: Secondary | ICD-10-CM | POA: Insufficient documentation

## 2013-01-08 DIAGNOSIS — M5430 Sciatica, unspecified side: Secondary | ICD-10-CM

## 2013-01-08 DIAGNOSIS — M51379 Other intervertebral disc degeneration, lumbosacral region without mention of lumbar back pain or lower extremity pain: Secondary | ICD-10-CM | POA: Insufficient documentation

## 2013-01-08 DIAGNOSIS — M5432 Sciatica, left side: Secondary | ICD-10-CM

## 2013-01-08 DIAGNOSIS — F411 Generalized anxiety disorder: Secondary | ICD-10-CM | POA: Insufficient documentation

## 2013-01-08 MED ORDER — TAPENTADOL HCL 50 MG PO TABS: 50.0000 mg | ORAL_TABLET | Freq: Four times a day (QID) | ORAL | Status: DC | PRN

## 2013-01-08 MED ORDER — GABAPENTIN 300 MG PO CAPS: 300.0000 mg | ORAL_CAPSULE | Freq: Every day | ORAL | Status: DC

## 2013-01-08 NOTE — Progress Notes (Signed)
   Subjective:    Patient ID: Eric Casey, male    DOB: 03-26-1975, 38 y.o.   MRN: [id number]  HPI Seen by Dr Jillyn HiddenBean Orthopedic surgeon  Felt surgery will be needed in future and may require multilevel fusion  C/o anxiety starting pain psych tomorrow  Nucynta 50mg  QID    Treadmill walking 20 min/ 1mile Doing push ups  Doing dishes and laundry  Increased nocturia And increased constipation Pain Inventory Average Pain 6 Pain Right Now 7 My pain is sharp, stabbing and aching  In the last 24 hours, has pain interfered with the following? General activity 6 Relation with others 6 Enjoyment of life 6 What TIME of day is your pain at its worst? daytime, evening Sleep (in general) Fair  Pain is worse with: walking, standing and some activites Pain improves with: rest, heat/ice and medication Relief from Meds: 6  Mobility walk without assistance transfers alone Do you have any goals in this area?  no  Function Do you have any goals in this area?  no  Neuro/Psych numbness tingling  Prior Studies Any changes since last visit?  no  Physicians involved in your care Any changes since last visit?  no   Family History  Problem Relation Age of Onset  . Heart disease Father   . Hypertension Father    History   Social History  . Marital Status: Married    Spouse Name: N/A    Number of Children: N/A  . Years of Education: N/A   Social History Main Topics  . Smoking status: Former Smoker    Types: Cigarettes    Quit date: 04/15/2006  . Smokeless tobacco: Never Used  . Alcohol Use: No  . Drug Use: No  . Sexual Activity: None   Other Topics Concern  . None   Social History Narrative  . None   History reviewed. No pertinent past surgical history. Past Medical History  Diagnosis Date  . High blood pressure   . Back pain, chronic   . Buttock pain    BP 133/70  Pulse 82  Resp 14  Ht 5\' 10"  (1.778 m)  Wt 239 lb (108.41 kg)  BMI 34.29 kg/m2   SpO2 97%      Review of Systems  Musculoskeletal: Positive for back pain.  Neurological: Positive for numbness.       Tingling  All other systems reviewed and are negative.       Objective:   Physical Exam  Nursing note and vitals reviewed. Constitutional: He is oriented to person, place, and time. He appears well-developed and well-nourished.  Neurological: He is alert and oriented to person, place, and time. He has normal strength and normal reflexes. No sensory deficit. Gait normal.  -slr  Psychiatric: He has a normal mood and affect.          Assessment & Plan:   1. 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 Patient has adjacent level degenerative changes at L3-4 and L5-S1.  Second opinion surgery consult felt that 3 level fusion will likely be necessary some years in the future  Continue Nucynta 50 mg 4 times a day  Using Gabapentin 300mg  each bedtime when necessary for left L5 distribution radicular pain No evidence of increased radiculopathy on examination today  Discussed vocational rehabilitation. 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

## 2013-01-16 ENCOUNTER — Other Ambulatory Visit: Payer: Self-pay

## 2013-01-16 MED ORDER — DICLOFENAC EPOLAMINE 1.3 % TD PTCH: 1.0000 | MEDICATED_PATCH | Freq: Two times a day (BID) | TRANSDERMAL | Status: DC

## 2013-02-07 ENCOUNTER — Encounter: Payer: Worker's Compensation | Admitting: Physical Medicine and Rehabilitation

## 2013-02-14 ENCOUNTER — Encounter: Payer: Self-pay | Admitting: Physical Medicine and Rehabilitation

## 2013-02-14 ENCOUNTER — Encounter
Payer: Worker's Compensation | Attending: Physical Medicine and Rehabilitation | Admitting: Physical Medicine and Rehabilitation

## 2013-02-14 VITALS — BP 128/69 | HR 80 | Resp 14 | Ht 70.0 in | Wt 238.0 lb

## 2013-02-14 DIAGNOSIS — M51379 Other intervertebral disc degeneration, lumbosacral region without mention of lumbar back pain or lower extremity pain: Secondary | ICD-10-CM

## 2013-02-14 DIAGNOSIS — M5430 Sciatica, unspecified side: Secondary | ICD-10-CM | POA: Insufficient documentation

## 2013-02-14 DIAGNOSIS — M5137 Other intervertebral disc degeneration, lumbosacral region: Secondary | ICD-10-CM | POA: Insufficient documentation

## 2013-02-14 DIAGNOSIS — G4701 Insomnia due to medical condition: Secondary | ICD-10-CM

## 2013-02-14 DIAGNOSIS — G8929 Other chronic pain: Secondary | ICD-10-CM | POA: Insufficient documentation

## 2013-02-14 DIAGNOSIS — M545 Low back pain, unspecified: Secondary | ICD-10-CM

## 2013-02-14 DIAGNOSIS — Z79899 Other long term (current) drug therapy: Secondary | ICD-10-CM

## 2013-02-14 MED ORDER — TAPENTADOL HCL 50 MG PO TABS: 50.0000 mg | ORAL_TABLET | Freq: Four times a day (QID) | ORAL | Status: DC | PRN

## 2013-02-14 NOTE — Progress Notes (Signed)
Subjective:    Patient ID: Eric Casey, male    DOB: [birth year], 38 y.o.   MRN: [id number]  HPI The patient complains about chronic low back pain which radiates into LLE>RLE. He has been evaluated by Dr Venetia Maxon and Dr. Jillyn Hidden and was told that he would eventually need surgery sometime  in the future. He is not a surgical candidate at this time. He continues to have back pain radiating to LLE mainly in L5 distribution. Reports that the problem has been stable. He reports that his anxiety level is stable. Denies problems with constipation.  He is here for medication refill. He continues to walk a mile daily in addition to doing push up and 5 lbs weights for UB strengthening.   Pain Inventory Average Pain 6 Pain Right Now 6 My pain is sharp, stabbing and aching  In the last 24 hours, has pain interfered with the following? General activity 6 Relation with others 6 Enjoyment of life 6 What TIME of day is your pain at its worst? evening Sleep (in general) Fair  Pain is worse with: walking, standing and some activites Pain improves with: rest, heat/ice and medication Relief from Meds: 6  Mobility walk without assistance how many minutes can you walk? 60 ability to climb steps?  yes do you drive?  yes Do you have any goals in this area?  no  Function Do you have any goals in this area?  no  Neuro/Psych No problems in this area  Prior Studies Any changes since last visit?  no  Physicians involved in your care Any changes since last visit?  no   Family History  Problem Relation Age of Onset  . Heart disease Father   . Hypertension Father    History   Social History  . Marital Status: Married    Spouse Name: N/A    Number of Children: N/A  . Years of Education: N/A   Social History Main Topics  . Smoking status: Former Smoker    Types: Cigarettes    Quit date: 04/15/2006  . Smokeless tobacco: Never Used  . Alcohol Use: No  . Drug Use: No  . Sexual Activity:  None   Other Topics Concern  . None   Social History Narrative  . None   History reviewed. No pertinent past surgical history. Past Medical History  Diagnosis Date  . High blood pressure   . Back pain, chronic   . Buttock pain    BP 128/69  Pulse 80  Resp 14  Ht 5\' 10"  (1.778 m)  Wt 238 lb (107.956 kg)  BMI 34.15 kg/m2  SpO2 98%  Opioid Risk Score: 5 Fall Risk Score: Low Fall Risk (0-5 points) (patient educated)   Review of Systems  Musculoskeletal: Positive for back pain.  All other systems reviewed and are negative.       Objective:   Physical Exam  Constitutional: He is oriented to person, place, and time. He appears well-developed and well-nourished.  HENT:  Head: Normocephalic and atraumatic.  Eyes: Conjunctivae are normal. Pupils are equal, round, and reactive to light.  Cardiovascular: Normal rate and regular rhythm.   Pulmonary/Chest: Effort normal and breath sounds normal.  Abdominal: Soft. Bowel sounds are normal.  Musculoskeletal: He exhibits no edema and no tenderness.  Sensation intact BUE/BLE. Strength equal BUE/BLE but some pain in lower back with ROM LLE. Lower back tender to palpation. DTRs 2/4. No clonus. No sensory deficits. He arises from chair without difficulty and  walks with narrow based gait.   Neurological: He is alert and oriented to person, place, and time.  Skin: Skin is warm and dry.  Psychiatric: He has a normal mood and affect. His speech is normal. Thought content normal. Cognition and memory are normal.          Assessment & Plan:  1. DDD L-spine with HNP L4/5 > L3/4:  To continue current exercise program. Pain control reasonable on current regimen of Nycunta , flector patch and celebrex Refilled: Nucynta 50 mg # 120 pills--use one pill every 6 hrs prn.   2. Sciatica: Continue Neurontin 300 mg at bedtime to help with pain as well as sleep hygiene.  He does not want regimen adjusted at this time.   3. Mood: Feels anxiety  levels are stable. Tries to use relaxation techniques but does not feel its effective. Discussed using  Neurontin for mood stabilization also.

## 2013-03-06 ENCOUNTER — Other Ambulatory Visit: Payer: Self-pay

## 2013-03-06 MED ORDER — DICLOFENAC EPOLAMINE 1.3 % TD PTCH: 1.0000 | MEDICATED_PATCH | Freq: Two times a day (BID) | TRANSDERMAL | Status: DC

## 2013-03-14 ENCOUNTER — Encounter: Payer: Self-pay | Admitting: Physical Medicine and Rehabilitation

## 2013-03-14 ENCOUNTER — Encounter
Payer: Worker's Compensation | Attending: Physical Medicine and Rehabilitation | Admitting: Physical Medicine and Rehabilitation

## 2013-03-14 VITALS — BP 118/69 | HR 83 | Resp 14 | Ht 70.0 in | Wt 237.0 lb

## 2013-03-14 DIAGNOSIS — Z79899 Other long term (current) drug therapy: Secondary | ICD-10-CM

## 2013-03-14 DIAGNOSIS — M5137 Other intervertebral disc degeneration, lumbosacral region: Secondary | ICD-10-CM | POA: Insufficient documentation

## 2013-03-14 DIAGNOSIS — G8929 Other chronic pain: Secondary | ICD-10-CM | POA: Insufficient documentation

## 2013-03-14 DIAGNOSIS — R39198 Other difficulties with micturition: Secondary | ICD-10-CM

## 2013-03-14 DIAGNOSIS — M51379 Other intervertebral disc degeneration, lumbosacral region without mention of lumbar back pain or lower extremity pain: Secondary | ICD-10-CM | POA: Insufficient documentation

## 2013-03-14 DIAGNOSIS — R3989 Other symptoms and signs involving the genitourinary system: Secondary | ICD-10-CM

## 2013-03-14 DIAGNOSIS — I10 Essential (primary) hypertension: Secondary | ICD-10-CM | POA: Insufficient documentation

## 2013-03-14 MED ORDER — TAPENTADOL HCL 50 MG PO TABS: 50.0000 mg | ORAL_TABLET | Freq: Three times a day (TID) | ORAL | Status: DC | PRN

## 2013-03-14 NOTE — Progress Notes (Signed)
Subjective:    Patient ID: Eric Casey, male    DOB: [birth year], 38 y.o.   MRN: [id number]  HPI The patient complains about chronic low back pain which radiates into LLE>RLE.  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 He has been evaluated by Dr Venetia MaxonStern and Dr. Jillyn HiddenBean and was told that he would eventually need surgery sometime in the future but  is not a surgical candidate at this time. He reports that he has had difficulty voiding for the last three weeks and he has been weaning off  medications to see if this might resolve his symptoms. He's not taking celebrex or gabapentin at this time and feels that symptoms might be minimally better. He continues to have difficulty emptying his bladder and has had frequent urination as well as nocturia (X 3-4 per wife)  He denies burning or stinging BLE. No increase in BLE numbness and no new weakness. No problems with bowels. He denies family history of prostate or testicular cancer/problems.   He had 6 month check up in the past few weeks and reports symptoms started a week past check up. He has not followed up with his physician for work up/symptoms.   Pain Inventory Average Pain 6 Pain Right Now 6 My pain is sharp, stabbing and aching  In the last 24 hours, has pain interfered with the following? General activity 6 Relation with others 6 Enjoyment of life 6 What TIME of day is your pain at its worst? evening Sleep (in general) Fair  Pain is worse with: walking and standing Pain improves with: rest, heat/ice and medication Relief from Meds: 6  Mobility walk without assistance how many minutes can you walk? 30 ability to climb steps?  yes do you drive?  yes transfers alone Do you have any goals in this area?  no  Function Do you have any goals in this area?  no  Neuro/Psych tingling spasms  Prior Studies Any changes since last visit?  no  Physicians involved in your care Any changes since last visit?  no   Family History  Problem  Relation Age of Onset  . Heart disease Father   . Hypertension Father    History   Social History  . Marital Status: Married    Spouse Name: N/A    Number of Children: N/A  . Years of Education: N/A   Social History Main Topics  . Smoking status: Former Smoker    Types: Cigarettes    Quit date: 04/15/2006  . Smokeless tobacco: Never Used  . Alcohol Use: No  . Drug Use: No  . Sexual Activity: None   Other Topics Concern  . None   Social History Narrative  . None   History reviewed. No pertinent past surgical history. Past Medical History  Diagnosis Date  . High blood pressure   . Back pain, chronic   . Buttock pain    BP 118/69  Pulse 83  Resp 14  Ht 5\' 10"  (1.778 m)  Wt 237 lb (107.502 kg)  BMI 34.01 kg/m2  SpO2 98%  Opioid Risk Score:   Fall Risk Score: Low Fall Risk (0-5 points) (pt educated on fall risk, pt given brochure previously)    Review of Systems  Respiratory: Negative for chest tightness.   Cardiovascular: Negative for chest pain.  Gastrointestinal: Positive for constipation. Negative for abdominal pain and abdominal distention.  Genitourinary: Positive for urgency, frequency and difficulty urinating.  Musculoskeletal: Positive for back pain and myalgias.  Neurological: Negative for tremors, weakness, light-headedness and numbness.       Tingling, spasms  Psychiatric/Behavioral: The patient is nervous/anxious.   All other systems reviewed and are negative.       Objective:   Physical Exam  Nursing note and vitals reviewed. Constitutional: He is oriented to person, place, and time. He appears well-developed and well-nourished.  HENT:  Head: Normocephalic and atraumatic.  Eyes: Conjunctivae are normal. Pupils are equal, round, and reactive to light.  Neck: Normal range of motion. Neck supple.  Cardiovascular: Normal rate and regular rhythm.   Pulmonary/Chest: Effort normal and breath sounds normal. No respiratory distress. He has no  wheezes.  Abdominal: Soft. Bowel sounds are normal. He exhibits no distension. There is no tenderness.  Musculoskeletal: He exhibits no edema and no tenderness.  He has normal motor tone and normal muscle bulk. Muscle testing reveals 5/5 muscle strength of the upper extremity, and 5/5 of the lower extremity. Has full range of motion in BUE/BLE. Does have restriction of spine with some tenderness L4 region. SLR negative. Sensory is intact and symmetric to light touch, pinprick and proprioception.   DTR in the upper and lower extremity are present and symmetric 2+. No clonus is noted.   Patient arises from chair without difficulty  Neurological: He is alert and oriented to person, place, and time.  Skin: Skin is warm and dry.          Assessment & Plan:  1. 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 We discussed causes from UTI, prostate problems as well as further degenerative changes affecting spinal cord.       -He was concerned that about narcotic SE and we decided to decrease Nucynta to tid to see if this would help.       -Urology consult ordered. I asked him to call his [facility] representative to facilitate referral.       -Call PMD and have UA/UCS checked.       -Follow up with NS if above workup negative.  Refilled: Nucynta 50 mg #90 pills--use one every 8 hours as needed.

## 2013-03-14 NOTE — Patient Instructions (Addendum)
Decrease pain medication to three times a day to see if this may help improve symptoms.  Call primary MD to have urine checked to rule out infection. Call worker's comp rep regarding Urology consult as well as recheck by Neurosurgeon if work up negative.

## 2013-04-04 ENCOUNTER — Other Ambulatory Visit: Payer: Self-pay | Admitting: *Deleted

## 2013-04-04 MED ORDER — TAPENTADOL HCL 50 MG PO TABS: 50.0000 mg | ORAL_TABLET | Freq: Three times a day (TID) | ORAL | Status: DC | PRN

## 2013-04-10 ENCOUNTER — Encounter: Payer: Self-pay | Admitting: *Deleted

## 2013-04-10 ENCOUNTER — Encounter: Payer: Worker's Compensation | Attending: Physical Medicine & Rehabilitation | Admitting: *Deleted

## 2013-04-10 VITALS — BP 132/71 | HR 77 | Resp 14

## 2013-04-10 DIAGNOSIS — Z87891 Personal history of nicotine dependence: Secondary | ICD-10-CM | POA: Insufficient documentation

## 2013-04-10 DIAGNOSIS — M5137 Other intervertebral disc degeneration, lumbosacral region: Secondary | ICD-10-CM | POA: Insufficient documentation

## 2013-04-10 DIAGNOSIS — M545 Low back pain, unspecified: Secondary | ICD-10-CM | POA: Insufficient documentation

## 2013-04-10 DIAGNOSIS — R351 Nocturia: Secondary | ICD-10-CM | POA: Insufficient documentation

## 2013-04-10 DIAGNOSIS — M79609 Pain in unspecified limb: Secondary | ICD-10-CM | POA: Insufficient documentation

## 2013-04-10 DIAGNOSIS — G8929 Other chronic pain: Secondary | ICD-10-CM | POA: Insufficient documentation

## 2013-04-10 DIAGNOSIS — M51379 Other intervertebral disc degeneration, lumbosacral region without mention of lumbar back pain or lower extremity pain: Secondary | ICD-10-CM | POA: Insufficient documentation

## 2013-04-10 DIAGNOSIS — R350 Frequency of micturition: Secondary | ICD-10-CM | POA: Insufficient documentation

## 2013-04-10 MED ORDER — TAPENTADOL HCL 50 MG PO TABS: 50.0000 mg | ORAL_TABLET | Freq: Four times a day (QID) | ORAL | Status: DC | PRN

## 2013-04-10 MED ORDER — GABAPENTIN 300 MG PO CAPS: 300.0000 mg | ORAL_CAPSULE | Freq: Every day | ORAL | Status: DC

## 2013-04-10 NOTE — Progress Notes (Signed)
Here for pill count and medication refills. Nucynta 50 mg #90  Fill date 03/14/13    Today NV# 16  VSS    Mr Eric Casey is wanting to go back to his 4 tablets per day of Nucynta 50mg .  He was cut back to tid last month and he says it is not working for him.  I called Dr Wynn BankerKirsteins to ask if this would be appropriate and he agreed that we could go back to qid.  I reprinted the rx for our NP Eric Casey to sign.  He will return to the clinic next month to see Dr Wynn BankerKirsteins.

## 2013-04-10 NOTE — Patient Instructions (Signed)
Follow up one month with Dr Kirsteins  

## 2013-05-16 ENCOUNTER — Ambulatory Visit (HOSPITAL_BASED_OUTPATIENT_CLINIC_OR_DEPARTMENT_OTHER): Payer: Worker's Compensation | Admitting: Physical Medicine & Rehabilitation

## 2013-05-16 ENCOUNTER — Encounter: Payer: Self-pay | Admitting: Physical Medicine & Rehabilitation

## 2013-05-16 ENCOUNTER — Encounter: Payer: Worker's Compensation | Attending: Physical Medicine and Rehabilitation

## 2013-05-16 VITALS — BP 125/70 | HR 78 | Resp 14 | Ht 70.0 in | Wt 236.2 lb

## 2013-05-16 DIAGNOSIS — M5430 Sciatica, unspecified side: Secondary | ICD-10-CM | POA: Diagnosis not present

## 2013-05-16 DIAGNOSIS — G8929 Other chronic pain: Secondary | ICD-10-CM | POA: Diagnosis present

## 2013-05-16 DIAGNOSIS — Z79899 Other long term (current) drug therapy: Secondary | ICD-10-CM | POA: Insufficient documentation

## 2013-05-16 DIAGNOSIS — M5137 Other intervertebral disc degeneration, lumbosacral region: Secondary | ICD-10-CM

## 2013-05-16 DIAGNOSIS — M51379 Other intervertebral disc degeneration, lumbosacral region without mention of lumbar back pain or lower extremity pain: Secondary | ICD-10-CM | POA: Insufficient documentation

## 2013-05-16 MED ORDER — CELECOXIB 200 MG PO CAPS: 200.0000 mg | ORAL_CAPSULE | Freq: Two times a day (BID) | ORAL | Status: DC

## 2013-05-16 MED ORDER — PREGABALIN 50 MG PO CAPS: 50.0000 mg | ORAL_CAPSULE | Freq: Every day | ORAL | Status: DC

## 2013-05-16 MED ORDER — TAPENTADOL HCL 50 MG PO TABS: 50.0000 mg | ORAL_TABLET | Freq: Four times a day (QID) | ORAL | Status: DC | PRN

## 2013-05-16 NOTE — Patient Instructions (Signed)
Trial lyrica celebrex once a day

## 2013-05-16 NOTE — Progress Notes (Signed)
Subjective:    Patient ID: Eric CourseChristopher Leath, male    DOB: Jun 29, 1975, 38 y.o.   MRN: [id number]  HPI Gabapentin causing drowsiness in the morning. Increased pain off of Celebrex restarted 200 mg once a day Tried on reduced Nucynta 50 mg 3 times a day was not as effective for pain as before times per day period did not change in the bladder symptoms. Bladder symptoms have overall improved however. Pain Inventory Average Pain 7 Pain Right Now 7 My pain is sharp, stabbing and aching  In the last 24 hours, has pain interfered with the following? General activity 6 Relation with others 7 Enjoyment of life 6 What TIME of day is your pain at its worst? evening and night Sleep (in general) Fair  Pain is worse with: walking, standing and some activites Pain improves with: rest, heat/ice and medication Relief from Meds: 6  Mobility walk without assistance how many minutes can you walk? 60  Function Do you have any goals in this area?  no  Neuro/Psych tingling spasms  Prior Studies Any changes since last visit?  no  Physicians involved in your care Any changes since last visit?  no   Family History  Problem Relation Age of Onset  . Heart disease Father   . Hypertension Father    History   Social History  . Marital Status: Married    Spouse Name: N/A    Number of Children: N/A  . Years of Education: N/A   Social History Main Topics  . Smoking status: Former Smoker    Types: Cigarettes    Quit date: 04/15/2006  . Smokeless tobacco: Never Used  . Alcohol Use: No  . Drug Use: No  . Sexual Activity: None   Other Topics Concern  . None   Social History Narrative  . None   History reviewed. No pertinent past surgical history. Past Medical History  Diagnosis Date  . High blood pressure   . Back pain, chronic   . Buttock pain    BP 125/70  Pulse 78  Resp 14  Ht 5\' 10"  (1.778 m)  Wt 236 lb 3.2 oz (107.14 kg)  BMI 33.89 kg/m2  SpO2 98%  Opioid  Risk Score:   Fall Risk Score: Low Fall Risk (0-5 points) (educated and handout given at previous visit.)  Review of Systems  Musculoskeletal:       Spasms  Neurological:       Tingling  All other systems reviewed and are negative.      Objective:   Physical Exam  Nursing note and vitals reviewed. Constitutional: He is oriented to person, place, and time. He appears well-developed and well-nourished.  HENT:  Head: Normocephalic and atraumatic.  Eyes: Conjunctivae and EOM are normal. Pupils are equal, round, and reactive to light.  Neurological: He is alert and oriented to person, place, and time. He has normal strength and normal reflexes. A sensory deficit is present. Gait normal.  Psychiatric: He has a normal mood and affect.    Rate leg raise negative Motor strength 5/5 bilateral hip flexors knee extensors ankle dorsiflexor Decreased bilateral L5 decreased left L4     Assessment & Plan:  1. 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 Patient has adjacent level degenerative changes at L3-4 and L5-S1.  Second opinion surgery consult felt that 3 level fusion will likely be necessary some years in the future  Continue Nucynta 50 mg 4 times a day  Trial  Lyrica 50 mg each bedtime for left L5 distribution radicular pain  No evidence of increased radiculopathy on examination today NP visit one month, M.D. visits 6 months  Also discussed vocational rehabilitation. Patient has gone through school but has not resumed any type of work

## 2013-06-17 ENCOUNTER — Encounter: Payer: Self-pay | Admitting: Registered Nurse

## 2013-06-17 ENCOUNTER — Encounter: Payer: Worker's Compensation | Attending: Registered Nurse | Admitting: Registered Nurse

## 2013-06-17 VITALS — BP 125/62 | HR 96 | Resp 14 | Ht 70.0 in | Wt 235.0 lb

## 2013-06-17 DIAGNOSIS — Z79899 Other long term (current) drug therapy: Secondary | ICD-10-CM | POA: Diagnosis not present

## 2013-06-17 DIAGNOSIS — M545 Low back pain, unspecified: Secondary | ICD-10-CM | POA: Diagnosis not present

## 2013-06-17 DIAGNOSIS — M51379 Other intervertebral disc degeneration, lumbosacral region without mention of lumbar back pain or lower extremity pain: Secondary | ICD-10-CM

## 2013-06-17 DIAGNOSIS — M5137 Other intervertebral disc degeneration, lumbosacral region: Secondary | ICD-10-CM | POA: Diagnosis not present

## 2013-06-17 DIAGNOSIS — Z5181 Encounter for therapeutic drug level monitoring: Secondary | ICD-10-CM

## 2013-06-17 DIAGNOSIS — G8929 Other chronic pain: Secondary | ICD-10-CM | POA: Diagnosis present

## 2013-06-17 DIAGNOSIS — Z87891 Personal history of nicotine dependence: Secondary | ICD-10-CM | POA: Insufficient documentation

## 2013-06-17 DIAGNOSIS — M5430 Sciatica, unspecified side: Secondary | ICD-10-CM

## 2013-06-17 MED ORDER — PREGABALIN 50 MG PO CAPS: 50.0000 mg | ORAL_CAPSULE | Freq: Every day | ORAL | Status: DC

## 2013-06-17 MED ORDER — TAPENTADOL HCL 50 MG PO TABS: 50.0000 mg | ORAL_TABLET | Freq: Four times a day (QID) | ORAL | Status: DC | PRN

## 2013-06-17 NOTE — Progress Notes (Signed)
Subjective:    Patient ID: Eric Casey, male    DOB: 1975/12/07, 38 y.o.   MRN: [id number]  HPI: [name] is a 38 year old male who returns for follow up for chronic pain and medication refill. He says his pain is located in his lower back and at times the pain radiates to the lower extremities anteriorally and posteriorly left greater than right. He rates his pain 7. His current exercise regime is staying active around the house and doing yard work such as Radiographer, therapeuticmowing and weeding. He wanted Lyrica sent to [facility], other script has been discarded. He wants a 90 day supply.  Pain Inventory Average Pain 6 Pain Right Now 7 My pain is sharp, tingling and aching  In the last 24 hours, has pain interfered with the following? General activity 7 Relation with others 7 Enjoyment of life 7 What TIME of day is your pain at its worst? evening and night Sleep (in general) Fair  Pain is worse with: walking, standing and some activites Pain improves with: rest, heat/ice and medication Relief from Meds: 6  Mobility walk without assistance how many minutes can you walk? 60 ability to climb steps?  yes do you drive?  yes  Function Do you have any goals in this area?  no  Neuro/Psych tingling spasms  Prior Studies Any changes since last visit?  no  Physicians involved in your care Any changes since last visit?  no   Family History  Problem Relation Age of Onset  . Heart disease Father   . Hypertension Father    History   Social History  . Marital Status: Married    Spouse Name: N/A    Number of Children: N/A  . Years of Education: N/A   Social History Main Topics  . Smoking status: Former Smoker    Types: Cigarettes    Quit date: 04/15/2006  . Smokeless tobacco: Never Used  . Alcohol Use: No  . Drug Use: No  . Sexual Activity: None   Other Topics Concern  . None   Social History Narrative  . None   History reviewed. No pertinent past  surgical history. Past Medical History  Diagnosis Date  . High blood pressure   . Back pain, chronic   . Buttock pain    BP 125/62  Pulse 96  Resp 14  Ht 5\' 10"  (1.778 m)  Wt 235 lb (106.595 kg)  BMI 33.72 kg/m2  SpO2 95%  Opioid Risk Score:   Fall Risk Score: Low Fall Risk (0-5 points) (previously educated and handout given for fall prevention in the home)  Review of Systems  Musculoskeletal: Positive for back pain.       Spasms  Neurological:       Tingling  All other systems reviewed and are negative.      Objective:   Physical Exam  Nursing note and vitals reviewed. Constitutional: He is oriented to person, place, and time. He appears well-developed and well-nourished.  HENT:  Head: Normocephalic and atraumatic.  Neck: Normal range of motion. Neck supple.  Cardiovascular: Normal rate and regular rhythm.   Pulmonary/Chest: Effort normal and breath sounds normal.  Musculoskeletal:  Normal Muscle Bulk and Muscle Testing Reveals: Upper Extremities and Lower extremities: Full ROM and Muscle Strength 5/5. Back without spinal or paraspinal tenderness. Arises from chair with ease. Narrow based gait.  Neurological: He is alert and oriented to person, place, and time.  Skin: Skin is warm and dry.  Psychiatric: He has a normal mood and affect.          Assessment & Plan:  1. 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 Patient has adjacent level degenerative changes at L3-4 and L5-S1.  Refilled: Nucynta 50 mg one tablet every 6 hours as needed #120. Continue Lyrica 50 mg daily for left L5 distribution radicular pain. Lyrica Called in to [facility].  20 minutes of face to face patient care time was spent during this visit. All questions were encouraged and answered.  F/U in 1 month

## 2013-06-21 ENCOUNTER — Telehealth: Payer: Self-pay

## 2013-06-21 NOTE — Telephone Encounter (Signed)
I called and spoke to Eric Casey's wife Eric Casey since he was asleep, to verify that he was taking the Lyrica instead of the gabapentin.  She verified this.  I called and spoke with the pharmacy and they have dispensed the Lyrica.

## 2013-06-21 NOTE — Telephone Encounter (Signed)
[facility] called to follow up on refill request for patients gabapentin 300mg  rx.  Please call in refill.

## 2013-07-15 ENCOUNTER — Encounter: Payer: Worker's Compensation | Attending: Registered Nurse | Admitting: Registered Nurse

## 2013-07-15 ENCOUNTER — Encounter: Payer: Self-pay | Admitting: Registered Nurse

## 2013-07-15 VITALS — BP 103/66 | HR 71 | Resp 14 | Wt 236.0 lb

## 2013-07-15 DIAGNOSIS — Z87891 Personal history of nicotine dependence: Secondary | ICD-10-CM | POA: Insufficient documentation

## 2013-07-15 DIAGNOSIS — IMO0002 Reserved for concepts with insufficient information to code with codable children: Secondary | ICD-10-CM | POA: Insufficient documentation

## 2013-07-15 DIAGNOSIS — M545 Low back pain, unspecified: Secondary | ICD-10-CM | POA: Diagnosis present

## 2013-07-15 DIAGNOSIS — M5137 Other intervertebral disc degeneration, lumbosacral region: Secondary | ICD-10-CM | POA: Diagnosis not present

## 2013-07-15 DIAGNOSIS — G8929 Other chronic pain: Secondary | ICD-10-CM | POA: Insufficient documentation

## 2013-07-15 DIAGNOSIS — R209 Unspecified disturbances of skin sensation: Secondary | ICD-10-CM | POA: Diagnosis not present

## 2013-07-15 DIAGNOSIS — Z79899 Other long term (current) drug therapy: Secondary | ICD-10-CM | POA: Diagnosis not present

## 2013-07-15 DIAGNOSIS — M62838 Other muscle spasm: Secondary | ICD-10-CM | POA: Insufficient documentation

## 2013-07-15 DIAGNOSIS — I10 Essential (primary) hypertension: Secondary | ICD-10-CM | POA: Insufficient documentation

## 2013-07-15 DIAGNOSIS — M5432 Sciatica, left side: Secondary | ICD-10-CM

## 2013-07-15 DIAGNOSIS — M5430 Sciatica, unspecified side: Secondary | ICD-10-CM

## 2013-07-15 DIAGNOSIS — M51379 Other intervertebral disc degeneration, lumbosacral region without mention of lumbar back pain or lower extremity pain: Secondary | ICD-10-CM | POA: Insufficient documentation

## 2013-07-15 DIAGNOSIS — Z5181 Encounter for therapeutic drug level monitoring: Secondary | ICD-10-CM

## 2013-07-15 DIAGNOSIS — M5431 Sciatica, right side: Secondary | ICD-10-CM

## 2013-07-15 MED ORDER — TAPENTADOL HCL 50 MG PO TABS: 50.0000 mg | ORAL_TABLET | Freq: Four times a day (QID) | ORAL | Status: DC | PRN

## 2013-07-15 MED ORDER — BACLOFEN 10 MG PO TABS: 10.0000 mg | ORAL_TABLET | Freq: Two times a day (BID) | ORAL | Status: DC

## 2013-07-15 NOTE — Patient Instructions (Signed)
Start Baclofen tonight. Start one tablet a day. If muscle spasms remain increase to twice a day. Call office with any questions.

## 2013-07-15 NOTE — Progress Notes (Signed)
Subjective:    Patient ID: Eric Casey, male    DOB: 1975/02/21, 38 y.o.   MRN: [id number]  HPI: [name] is a 38 year old male who returns for follow up for chronic pain and medication refill. He says his pain is located in his lower back. The pain radiates down bilateral legs anteriorially and posteriorly. He rates his pain 7. His current exercise regime is walking and doing yard work. He says he is having a hard time getting his medications filled. He has to call workman's comp every month and this delays his prescriptions 5 days. He is going on vacation August 16 th for a week, will have a dilemma with getting prescription filled. I wrote a script to extend his medications for a week until he get's back. Was concerned workman's compensation won't pay. The script is at the office. We have placed a call to [facility]. They are suppose to call the office back on 07/16/13. The script for Nucyntas 50 mg every 6 hours as needed #152 is at the office. He was given script for the 120 tablets. If workman's Compensation agrees to pay for the extra week of medications. [name] will come and pick up the new script and return the script of 120 tablets. He verbalizes understanding. Mr. Patmon also sent out an email to his worker.  Pain Inventory Average Pain 6 Pain Right Now 7 My pain is sharp, stabbing and aching  In the last 24 hours, has pain interfered with the following? General activity 5 Relation with others 6 Enjoyment of life 6 What TIME of day is your pain at its worst? daytime and night Sleep (in general) Fair  Pain is worse with: walking, bending, standing and some activites Pain improves with: rest, heat/ice and medication Relief from Meds: 6  Mobility walk without assistance how many minutes can you walk? 60 ability to climb steps?  yes do you drive?  yes  Function Do you have any goals in this area?   no  Neuro/Psych tingling spasms  Prior Studies Any changes since last visit?  no  Physicians involved in your care Any changes since last visit?  no   Family History  Problem Relation Age of Onset  . Heart disease Father   . Hypertension Father    History   Social History  . Marital Status: Married    Spouse Name: N/A    Number of Children: N/A  . Years of Education: N/A   Social History Main Topics  . Smoking status: Former Smoker    Types: Cigarettes    Quit date: 04/15/2006  . Smokeless tobacco: Never Used  . Alcohol Use: No  . Drug Use: No  . Sexual Activity: None   Other Topics Concern  . None   Social History Narrative  . None   History reviewed. No pertinent past surgical history. Past Medical History  Diagnosis Date  . High blood pressure   . Back pain, chronic   . Buttock pain    BP 103/66  Pulse 71  Resp 14  Wt 236 lb (107.049 kg)  SpO2 96%  Opioid Risk Score:   Fall Risk Score: Low Fall Risk (0-5 points) (previously educated and given handout for fall prevention in the home) Review of Systems  Musculoskeletal:       Spasms  Neurological:       Tingling  All other systems reviewed and are negative.      Objective:  Physical Exam  Nursing note and vitals reviewed. Constitutional: He is oriented to person, place, and time. He appears well-developed and well-nourished.  HENT:  Head: Normocephalic and atraumatic.  Neck: Normal range of motion. Neck supple.  Cardiovascular: Normal rate and regular rhythm.   Pulmonary/Chest: Effort normal and breath sounds normal.  Musculoskeletal:  Normal Muscle Bulk and Muscle Testing Reveals: Upper Extremities: Full ROM and Muscle Strength 5/5 Spinal Forward Flexion:45 Degrees and Extension 10 degrees. Lumbar Paraspinal Tenderness: L-3- L-5 Bilateral Gluteal Maximus Tenderness Arises from chair with ease Narrow based Gait     Neurological: He is alert and oriented to person, place, and time.   Skin: Skin is warm and dry.  Psychiatric: He has a normal mood and affect.          Assessment & Plan:  1. 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 Patient has adjacent level degenerative changes at L3-4 and L5-S1.  Refilled: Nucynta 50 mg one tablet every 6 hours as needed #120. Continue Lyrica 50 mg daily for left L5 distribution radicular pain.  2. Muscle Spasms: RX: Baclofen 10 mg BID #60. Patient was instructed to start daily for a week if muscle spasms persits increase to start BID.  20 minutes of face to face patient care time was spent during this visit. All questions were encouraged and answered.   F/U in 1 month

## 2013-07-17 ENCOUNTER — Telehealth: Payer: Self-pay

## 2013-07-17 NOTE — Telephone Encounter (Signed)
Patient called to let us know that he does not need a 5week rx.  His insurance will not pay for it, but they will cover an early fill for next month.  Appointment made.  Nucynta #152 shredded.

## 2013-07-17 NOTE — Telephone Encounter (Signed)
error 

## 2013-08-01 ENCOUNTER — Other Ambulatory Visit: Payer: Self-pay

## 2013-08-01 MED ORDER — DICLOFENAC EPOLAMINE 1.3 % TD PTCH: 1.0000 | MEDICATED_PATCH | Freq: Two times a day (BID) | TRANSDERMAL | Status: DC

## 2013-08-01 NOTE — Telephone Encounter (Signed)
Heather @ [facility] called requesting a verbal order to refill patient's flector patches. Last fill 03/2013. Verbal order given to refill Flector patches.

## 2013-08-14 ENCOUNTER — Other Ambulatory Visit: Payer: Self-pay

## 2013-08-14 MED ORDER — CELECOXIB 200 MG PO CAPS: 200.0000 mg | ORAL_CAPSULE | Freq: Two times a day (BID) | ORAL | Status: DC

## 2013-08-15 ENCOUNTER — Encounter: Payer: Worker's Compensation | Attending: Registered Nurse | Admitting: Registered Nurse

## 2013-08-15 ENCOUNTER — Encounter: Payer: Self-pay | Admitting: Registered Nurse

## 2013-08-15 VITALS — BP 123/70 | HR 79 | Resp 14 | Ht 70.0 in | Wt 233.0 lb

## 2013-08-15 DIAGNOSIS — M51379 Other intervertebral disc degeneration, lumbosacral region without mention of lumbar back pain or lower extremity pain: Secondary | ICD-10-CM | POA: Insufficient documentation

## 2013-08-15 DIAGNOSIS — Z5181 Encounter for therapeutic drug level monitoring: Secondary | ICD-10-CM

## 2013-08-15 DIAGNOSIS — G8929 Other chronic pain: Secondary | ICD-10-CM | POA: Insufficient documentation

## 2013-08-15 DIAGNOSIS — Z79899 Other long term (current) drug therapy: Secondary | ICD-10-CM

## 2013-08-15 DIAGNOSIS — M5430 Sciatica, unspecified side: Secondary | ICD-10-CM | POA: Diagnosis not present

## 2013-08-15 DIAGNOSIS — M5137 Other intervertebral disc degeneration, lumbosacral region: Secondary | ICD-10-CM

## 2013-08-15 DIAGNOSIS — M5431 Sciatica, right side: Secondary | ICD-10-CM

## 2013-08-15 DIAGNOSIS — M5432 Sciatica, left side: Secondary | ICD-10-CM

## 2013-08-15 MED ORDER — TAPENTADOL HCL 50 MG PO TABS: 50.0000 mg | ORAL_TABLET | Freq: Four times a day (QID) | ORAL | Status: DC | PRN

## 2013-08-15 NOTE — Progress Notes (Signed)
Subjective:    Patient ID: Eric Casey, male    DOB: Oct 07, 1975, 38 y.o.   MRN: [id number]  HPI: [name] is a 38 year old male who returns for follow up for chronic pain and medication refill. He says his pain is located in his lower back. He says he has been having joint pain in his knees. He has been using his Flector patches, with relief noted. He rates his pain 7. His current exercise regime is walking and doing yard work.  Pain Inventory Average Pain 6 Pain Right Now 7 My pain is sharp, burning and aching  In the last 24 hours, has pain interfered with the following? General activity 6 Relation with others 7 Enjoyment of life 6 What TIME of day is your pain at its worst? evening, night Sleep (in general) Fair  Pain is worse with: walking, bending and standing Pain improves with: rest, heat/ice and medication Relief from Meds: 6  Mobility walk without assistance ability to climb steps?  yes transfers alone Do you have any goals in this area?  no  Function Do you have any goals in this area?  no  Neuro/Psych No problems in this area  Prior Studies Any changes since last visit?  no  Physicians involved in your care Any changes since last visit?  no   Family History  Problem Relation Age of Onset  . Heart disease Father   . Hypertension Father    History   Social History  . Marital Status: Married    Spouse Name: N/A    Number of Children: N/A  . Years of Education: N/A   Social History Main Topics  . Smoking status: Former Smoker    Types: Cigarettes    Quit date: 04/15/2006  . Smokeless tobacco: Never Used  . Alcohol Use: No  . Drug Use: No  . Sexual Activity: None   Other Topics Concern  . None   Social History Narrative  . None   History reviewed. No pertinent past surgical history. Past Medical History  Diagnosis Date  . High blood pressure   . Back pain, chronic   . Buttock pain    BP 123/70  Pulse 79   Resp 14  Ht 5\' 10"  (1.778 m)  Wt 233 lb (105.688 kg)  BMI 33.43 kg/m2  SpO2 97%  Opioid Risk Score:   Fall Risk Score: Low Fall Risk (0-5 points) (pt educated, brochure declined)    Review of Systems  Musculoskeletal: Positive for back pain.  All other systems reviewed and are negative.      Objective:   Physical Exam  Nursing note and vitals reviewed. Constitutional: He is oriented to person, place, and time. He appears well-developed and well-nourished.  HENT:  Head: Normocephalic and atraumatic.  Neck: Normal range of motion. Neck supple.  Cardiovascular: Normal rate and regular rhythm.   Pulmonary/Chest: Effort normal.  Musculoskeletal:  Normal Muscle bulk and Muscle Testing Reveals: Upper Extremities: Full ROM and Muscle strength 5/5 Spinal Forward Flexion 30 Degrees and Extension 20 Degrees. Lumbar Paraspinal Tenderness: L-3- L-5 Lower extremities: Full ROM and Muscle strength 5/5 Arises from chair with ease Narrow based gait  Neurological: He is alert and oriented to person, place, and time.  Skin: Skin is warm and dry.  Psychiatric: He has a normal mood and affect.          Assessment & Plan:  1. 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  Patient has adjacent level degenerative changes at L3-4 and L5-S1.  Refilled: Nucynta 50 mg one tablet every 6 hours as needed #120. Continue Lyrica 50 mg daily for left L5 distribution radicular pain.  2. Muscle Spasms: Continue Baclofen 10 mg BID #60.   15 minutes of face to face patient care time was spent during this visit. All questions were encouraged and answered.   F/U in 1 month

## 2013-08-26 ENCOUNTER — Ambulatory Visit: Payer: Self-pay | Admitting: Registered Nurse

## 2013-09-18 ENCOUNTER — Encounter: Payer: Worker's Compensation | Attending: Registered Nurse | Admitting: Registered Nurse

## 2013-09-18 ENCOUNTER — Encounter: Payer: Self-pay | Admitting: Registered Nurse

## 2013-09-18 VITALS — BP 108/65 | HR 74 | Resp 14 | Wt 238.6 lb

## 2013-09-18 DIAGNOSIS — M51379 Other intervertebral disc degeneration, lumbosacral region without mention of lumbar back pain or lower extremity pain: Secondary | ICD-10-CM

## 2013-09-18 DIAGNOSIS — M5432 Sciatica, left side: Secondary | ICD-10-CM

## 2013-09-18 DIAGNOSIS — M5430 Sciatica, unspecified side: Secondary | ICD-10-CM | POA: Diagnosis not present

## 2013-09-18 DIAGNOSIS — Z5181 Encounter for therapeutic drug level monitoring: Secondary | ICD-10-CM | POA: Diagnosis not present

## 2013-09-18 DIAGNOSIS — Z79899 Other long term (current) drug therapy: Secondary | ICD-10-CM

## 2013-09-18 DIAGNOSIS — M5431 Sciatica, right side: Secondary | ICD-10-CM

## 2013-09-18 DIAGNOSIS — M5137 Other intervertebral disc degeneration, lumbosacral region: Secondary | ICD-10-CM | POA: Diagnosis present

## 2013-09-18 MED ORDER — TAPENTADOL HCL 50 MG PO TABS: 50.0000 mg | ORAL_TABLET | Freq: Four times a day (QID) | ORAL | Status: DC | PRN

## 2013-09-18 NOTE — Progress Notes (Signed)
Subjective:    Patient ID: Eric Casey, male    DOB: 08/20/1975, 38 y.o.   MRN: [id number]  HPI: Eric Casey is a 38 year old male who returns for follow up for chronic pain and medication refill. He's complaining of generalized pain all over. He rates his pain 7. His current exercise regime is walking, pool therapy and doing yard work. He says he has been have more excruciating back pain. He says he will be calling Dr. Jerelyn Charles office for an appointment. He will call with all detail information. Wife in room all questions answered.  Pain Inventory Average Pain 6 Pain Right Now 7 My pain is sharp, burning, stabbing and aching  In the last 24 hours, has pain interfered with the following? General activity 6 Relation with others 7 Enjoyment of life 6 What TIME of day is your pain at its worst? daytime and evening Sleep (in general) Fair  Pain is worse with: walking, standing and some activites Pain improves with: rest, heat/ice and medication Relief from Meds: no answer  Mobility walk without assistance ability to climb steps?  yes do you drive?  yes  Function Do you have any goals in this area?  no  Neuro/Psych spasms  Prior Studies Any changes since last visit?  no  Physicians involved in your care Any changes since last visit?  no   Family History  Problem Relation Age of Onset  . Heart disease Father   . Hypertension Father    History   Social History  . Marital Status: Married    Spouse Name: N/A    Number of Children: N/A  . Years of Education: N/A   Social History Main Topics  . Smoking status: Former Smoker    Types: Cigarettes    Quit date: 04/15/2006  . Smokeless tobacco: Never Used  . Alcohol Use: No  . Drug Use: No  . Sexual Activity: None   Other Topics Concern  . None   Social History Narrative  . None   History reviewed. No pertinent past surgical history. Past Medical History  Diagnosis Date  . High blood  pressure   . Back pain, chronic   . Buttock pain    BP 108/65  Pulse 74  Resp 14  Wt 238 lb 9.6 oz (108.228 kg)  SpO2 100%  Opioid Risk Score:   Fall Risk Score: Low Fall Risk (0-5 points) (previously educated and declined handout)  Review of Systems  Musculoskeletal:       Spasms  All other systems reviewed and are negative.      Objective:   Physical Exam  Nursing note and vitals reviewed. Constitutional: He is oriented to person, place, and time. He appears well-developed and well-nourished.  HENT:  Head: Normocephalic and atraumatic.  Neck: Normal range of motion. Neck supple.  Cardiovascular: Normal rate and regular rhythm.   Pulmonary/Chest: Effort normal and breath sounds normal.  Musculoskeletal:  Normal Muscle Bulk and Muscle Testing Reveals: Upper Extremities: Full ROM and Muscle Strength 5/5 Lumbar Paraspinal tenderness: L-3- L-5 Lower Extremities: Full ROM and Muscle Strength 5/5 Arises from chair with ease Narrow Based Gait  Neurological: He is alert and oriented to person, place, and time.  Skin: Skin is warm and dry.  Psychiatric: He has a normal mood and affect.          Assessment & Plan:  1. 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 Patient has adjacent level degenerative  changes at L3-4 and L5-S1.  Refilled: Nucynta 50 mg one tablet every 6 hours as needed #120. Continue Lyrica 50 mg daily for left L5 distribution radicular pain.  2. Muscle Spasms: No Complaints Today. Continue Baclofen 10 mg BID #60.   20 minutes of face to face patient care time was spent during this visit. All questions were encouraged and answered.   F/U in 1 month

## 2013-10-16 ENCOUNTER — Encounter: Payer: Worker's Compensation | Attending: Registered Nurse | Admitting: Registered Nurse

## 2013-10-16 ENCOUNTER — Encounter: Payer: Self-pay | Admitting: Registered Nurse

## 2013-10-16 VITALS — BP 136/84 | HR 76 | Resp 14 | Ht 70.0 in | Wt 234.0 lb

## 2013-10-16 DIAGNOSIS — Z760 Encounter for issue of repeat prescription: Secondary | ICD-10-CM | POA: Insufficient documentation

## 2013-10-16 DIAGNOSIS — M5137 Other intervertebral disc degeneration, lumbosacral region: Secondary | ICD-10-CM

## 2013-10-16 DIAGNOSIS — I10 Essential (primary) hypertension: Secondary | ICD-10-CM | POA: Diagnosis not present

## 2013-10-16 DIAGNOSIS — Z79899 Other long term (current) drug therapy: Secondary | ICD-10-CM

## 2013-10-16 DIAGNOSIS — M545 Low back pain: Secondary | ICD-10-CM | POA: Insufficient documentation

## 2013-10-16 DIAGNOSIS — M79604 Pain in right leg: Secondary | ICD-10-CM

## 2013-10-16 DIAGNOSIS — G8929 Other chronic pain: Secondary | ICD-10-CM | POA: Insufficient documentation

## 2013-10-16 DIAGNOSIS — Z5181 Encounter for therapeutic drug level monitoring: Secondary | ICD-10-CM

## 2013-10-16 DIAGNOSIS — M5136 Other intervertebral disc degeneration, lumbar region: Secondary | ICD-10-CM | POA: Insufficient documentation

## 2013-10-16 MED ORDER — TAPENTADOL HCL 50 MG PO TABS: 50.0000 mg | ORAL_TABLET | Freq: Four times a day (QID) | ORAL | Status: DC | PRN

## 2013-10-16 MED ORDER — BACLOFEN 10 MG PO TABS: 10.0000 mg | ORAL_TABLET | Freq: Two times a day (BID) | ORAL | Status: DC

## 2013-10-16 NOTE — Progress Notes (Signed)
Subjective:    Patient ID: Eric Casey, male    DOB: [birth year], 38 y.o.   MRN: [id number]  HPI: Eric Casey is a 38 year old male who returns for follow up for chronic pain and medication refill. He says his pain is located in his mid-lower back. He rates his pain 7. His current exercise regime is walking and working around the house. He's awaiting on workman's compensation approval for appointment with Dr. Jillyn HiddenBean. Wife in room all questions answered.   Pain Inventory Average Pain 6 Pain Right Now 7 My pain is constant, burning, stabbing and tingling  In the last 24 hours, has pain interfered with the following? General activity 6 Relation with others 6 Enjoyment of life 6 What TIME of day is your pain at its worst? daytime and night Sleep (in general) Fair  Pain is worse with: walking, bending and standing Pain improves with: rest, heat/ice and medication Relief from Meds: 6  Mobility walk without assistance how many minutes can you walk? 60 minutes ability to climb steps?  yes do you drive?  yes  Function not employed: date last employed 05/2011  Neuro/Psych No problems in this area  Prior Studies Any changes since last visit?  yes  Physicians involved in your care Any changes since last visit?  yes   Family History  Problem Relation Age of Onset  . Heart disease Father   . Hypertension Father    History   Social History  . Marital Status: Married    Spouse Name: N/A    Number of Children: N/A  . Years of Education: N/A   Social History Main Topics  . Smoking status: Former Smoker    Types: Cigarettes    Quit date: 04/15/2006  . Smokeless tobacco: Never Used  . Alcohol Use: No  . Drug Use: No  . Sexual Activity: None   Other Topics Concern  . None   Social History Narrative  . None   No past surgical history on file. Past Medical History  Diagnosis Date  . High blood pressure   . Back pain, chronic   . Buttock pain     BP 136/84  Pulse 76  Resp 14  Ht 5\' 10"  (1.778 m)  Wt 234 lb (106.142 kg)  BMI 33.58 kg/m2  SpO2 98%  Opioid Risk Score:   Fall Risk Score: Low Fall Risk (0-5 points)  Review of Systems     Objective:   Physical Exam  Nursing note and vitals reviewed. Constitutional: He is oriented to person, place, and time. He appears well-developed and well-nourished.  HENT:  Head: Normocephalic and atraumatic.  Neck: Normal range of motion. Neck supple.  Cardiovascular: Normal rate and regular rhythm.   Pulmonary/Chest: Effort normal and breath sounds normal.  Musculoskeletal:  Normal Muscle Bulk and Muscle testing Reveals: Upper extremities: Full ROM and Muscle Strength 5/5 Lumbar Paraspinal Tenderness: L-3- L-5 Lower extremities: Full ROM and Muscle Strength 5/5 Arises from chair with ease Narrow Based Gait  Neurological: He is alert and oriented to person, place, and time.  Skin: Skin is warm and dry.  Psychiatric: He has a normal mood and affect.          Assessment & Plan:  1. 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 Patient has adjacent level degenerative changes at L3-4 and L5-S1.  Refilled: Nucynta 50 mg one tablet every 6 hours as needed #120. Continue Lyrica 50 mg daily for left  L5 distribution radicular pain.  2. Muscle Spasms: No Complaints Today. Continue Baclofen 10 mg BID #60.  20 minutes of face to face patient care time was spent during this visit. All questions were encouraged and answered.  F/U in 1 month

## 2013-11-14 ENCOUNTER — Encounter: Payer: Worker's Compensation | Attending: Registered Nurse | Admitting: Registered Nurse

## 2013-11-14 ENCOUNTER — Encounter: Payer: Self-pay | Admitting: Registered Nurse

## 2013-11-14 VITALS — BP 120/72 | HR 78

## 2013-11-14 DIAGNOSIS — M5431 Sciatica, right side: Secondary | ICD-10-CM

## 2013-11-14 DIAGNOSIS — Z5181 Encounter for therapeutic drug level monitoring: Secondary | ICD-10-CM

## 2013-11-14 DIAGNOSIS — G8929 Other chronic pain: Secondary | ICD-10-CM | POA: Diagnosis not present

## 2013-11-14 DIAGNOSIS — M5116 Intervertebral disc disorders with radiculopathy, lumbar region: Secondary | ICD-10-CM | POA: Insufficient documentation

## 2013-11-14 DIAGNOSIS — M79604 Pain in right leg: Secondary | ICD-10-CM | POA: Insufficient documentation

## 2013-11-14 DIAGNOSIS — M545 Low back pain: Secondary | ICD-10-CM

## 2013-11-14 DIAGNOSIS — Z760 Encounter for issue of repeat prescription: Secondary | ICD-10-CM | POA: Diagnosis not present

## 2013-11-14 DIAGNOSIS — I10 Essential (primary) hypertension: Secondary | ICD-10-CM | POA: Diagnosis not present

## 2013-11-14 DIAGNOSIS — M5432 Sciatica, left side: Secondary | ICD-10-CM

## 2013-11-14 DIAGNOSIS — M79605 Pain in left leg: Secondary | ICD-10-CM | POA: Insufficient documentation

## 2013-11-14 DIAGNOSIS — M62838 Other muscle spasm: Secondary | ICD-10-CM | POA: Insufficient documentation

## 2013-11-14 DIAGNOSIS — Z79899 Other long term (current) drug therapy: Secondary | ICD-10-CM

## 2013-11-14 DIAGNOSIS — Z87891 Personal history of nicotine dependence: Secondary | ICD-10-CM | POA: Diagnosis not present

## 2013-11-14 DIAGNOSIS — M5137 Other intervertebral disc degeneration, lumbosacral region: Secondary | ICD-10-CM

## 2013-11-14 MED ORDER — TAPENTADOL HCL 50 MG PO TABS: 50.0000 mg | ORAL_TABLET | Freq: Four times a day (QID) | ORAL | Status: DC | PRN

## 2013-11-14 NOTE — Progress Notes (Signed)
Subjective:    Patient ID: Eric Casey, male    DOB: 1975-08-11, 38 y.o.   MRN: [id number]  HPI: Eric Casey is a 38 year old male who returns for follow up for chronic pain and medication refill. He says his pain is located in his bilateral buttocks,lower back which radiates posteriorly into lower extremities. He rates his pain 6. His current exercise regime is walking and working around the house. He's awaiting on workman's compensation approval for appointment with Dr. Jillyn HiddenBean. Wife in room all questions answered   Pain Inventory Average Pain 6 Pain Right Now 6 My pain is sharp, stabbing and tingling  In the last 24 hours, has pain interfered with the following? General activity 6 Relation with others 7 Enjoyment of life 7 What TIME of day is your pain at its worst? daytime, night Sleep (in general) Poor  Pain is worse with: walking, standing and some activites Pain improves with: rest, therapy/exercise and medication Relief from Meds: 6  Mobility walk without assistance how many minutes can you walk? 60 ability to climb steps?  yes do you drive?  yes Do you have any goals in this area?  no  Function disabled: date disabled temporary Do you have any goals in this area?  no  Neuro/Psych numbness spasms  Prior Studies Any changes since last visit?  no  Physicians involved in your care Any changes since last visit?  no   Family History  Problem Relation Age of Onset  . Heart disease Father   . Hypertension Father    History   Social History  . Marital Status: Married    Spouse Name: N/A    Number of Children: N/A  . Years of Education: N/A   Social History Main Topics  . Smoking status: Former Smoker    Types: Cigarettes    Quit date: 04/15/2006  . Smokeless tobacco: Never Used  . Alcohol Use: No  . Drug Use: No  . Sexual Activity: None   Other Topics Concern  . None   Social History Narrative   History reviewed. No  pertinent past surgical history. Past Medical History  Diagnosis Date  . High blood pressure   . Back pain, chronic   . Buttock pain    BP 120/72 mmHg  Pulse 78  SpO2 97%  Opioid Risk Score:   Fall Risk Score: Low Fall Risk (0-5 points)  Review of Systems  Musculoskeletal: Positive for back pain.  All other systems reviewed and are negative.      Objective:   Physical Exam  Constitutional: He is oriented to person, place, and time. He appears well-developed and well-nourished.  HENT:  Head: Normocephalic and atraumatic.  Neck: Normal range of motion. Neck supple.  Cardiovascular: Normal rate and regular rhythm.   Pulmonary/Chest: Effort normal and breath sounds normal.  Musculoskeletal:  Normal Muscle Bulk and Muscle Testing Reveals: Upper Extremities: Full ROM and Muscle strength 5/5 Lumbar Paraspinal Tenderness: L-3- L-5 Bilateral Gluteal Maximus Tenderness Lower Extremities: Full ROM and Muscle Strength 5/5 Arises from chair with ease Narrow Based Gait  Neurological: He is alert and oriented to person, place, and time.  Skin: Skin is warm and dry.  Psychiatric: He has a normal mood and affect.  Nursing note and vitals reviewed.         Assessment & Plan:  1. 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 Patient has adjacent level degenerative changes at L3-4 and L5-S1.  Refilled: Nucynta 50 mg one tablet every 6 hours as needed #120. Continue Lyrica 50 mg daily for left L5 distribution radicular pain.  2. Muscle Spasms: No Complaints Today. Continue Baclofen 10 mg BID #60.  20 minutes of face to face patient care time was spent during this visit. All questions were encouraged and answered.  F/U in 1 month

## 2013-12-16 ENCOUNTER — Encounter: Payer: Worker's Compensation | Attending: Registered Nurse | Admitting: Registered Nurse

## 2013-12-16 ENCOUNTER — Encounter: Payer: Self-pay | Admitting: Registered Nurse

## 2013-12-16 VITALS — BP 125/69 | HR 96 | Resp 14 | Ht 70.0 in | Wt 240.0 lb

## 2013-12-16 DIAGNOSIS — M5116 Intervertebral disc disorders with radiculopathy, lumbar region: Secondary | ICD-10-CM | POA: Insufficient documentation

## 2013-12-16 DIAGNOSIS — G8929 Other chronic pain: Secondary | ICD-10-CM | POA: Diagnosis not present

## 2013-12-16 DIAGNOSIS — Z760 Encounter for issue of repeat prescription: Secondary | ICD-10-CM | POA: Diagnosis not present

## 2013-12-16 DIAGNOSIS — M545 Low back pain: Secondary | ICD-10-CM | POA: Insufficient documentation

## 2013-12-16 DIAGNOSIS — M79605 Pain in left leg: Secondary | ICD-10-CM | POA: Diagnosis not present

## 2013-12-16 DIAGNOSIS — Z87891 Personal history of nicotine dependence: Secondary | ICD-10-CM | POA: Diagnosis not present

## 2013-12-16 DIAGNOSIS — Z5181 Encounter for therapeutic drug level monitoring: Secondary | ICD-10-CM

## 2013-12-16 DIAGNOSIS — M62838 Other muscle spasm: Secondary | ICD-10-CM | POA: Insufficient documentation

## 2013-12-16 DIAGNOSIS — M79604 Pain in right leg: Secondary | ICD-10-CM | POA: Insufficient documentation

## 2013-12-16 DIAGNOSIS — I10 Essential (primary) hypertension: Secondary | ICD-10-CM | POA: Diagnosis not present

## 2013-12-16 DIAGNOSIS — M5137 Other intervertebral disc degeneration, lumbosacral region: Secondary | ICD-10-CM

## 2013-12-16 DIAGNOSIS — Z79899 Other long term (current) drug therapy: Secondary | ICD-10-CM

## 2013-12-16 MED ORDER — TAPENTADOL HCL 50 MG PO TABS: 50.0000 mg | ORAL_TABLET | Freq: Four times a day (QID) | ORAL | Status: DC | PRN

## 2013-12-16 NOTE — Progress Notes (Signed)
Subjective:    Patient ID: Eric Casey, male    DOB: 2/Rolene Course24/1977, 38 y.o.   MRN: [id number]  HPI: Eric Casey is a 38 year old male who returns for follow up for chronic pain and medication refill. He says his pain is located in his lower back. He rates his pain 7. His current exercise regime is walking and yard work  (blowing leaves).  Pain Inventory Average Pain 6 Pain Right Now 7 My pain is sharp, stabbing and tingling  In the last 24 hours, has pain interfered with the following? General activity 6 Relation with others 6 Enjoyment of life 7 What TIME of day is your pain at its worst? daytime, night Sleep (in general) Fair  Pain is worse with: walking, standing and some activites Pain improves with: rest, heat/ice and medication Relief from Meds: 6  Mobility walk without assistance ability to climb steps?  yes do you drive?  yes Do you have any goals in this area?  no  Function Do you have any goals in this area?  no  Neuro/Psych tingling spasms  Prior Studies Any changes since last visit?  no  Physicians involved in your care Any changes since last visit?  no   Family History  Problem Relation Age of Onset  . Heart disease Father   . Hypertension Father    History   Social History  . Marital Status: Married    Spouse Name: N/A    Number of Children: N/A  . Years of Education: N/A   Social History Main Topics  . Smoking status: Former Smoker    Types: Cigarettes    Quit date: 04/15/2006  . Smokeless tobacco: Never Used  . Alcohol Use: No  . Drug Use: No  . Sexual Activity: None   Other Topics Concern  . None   Social History Narrative   History reviewed. No pertinent past surgical history. Past Medical History  Diagnosis Date  . High blood pressure   . Back pain, chronic   . Buttock pain    BP 125/69 mmHg  Pulse 96  Resp 14  Ht 5\' 10"  (1.778 m)  Wt 240 lb (108.863 kg)  BMI 34.44 kg/m2  SpO2 96%  Opioid  Risk Score:   Fall Risk Score: Low Fall Risk (0-5 points) (pt declined pamphlet) Review of Systems  Neurological:       Tingling spasms  All other systems reviewed and are negative.      Objective:   Physical Exam  Constitutional: He is oriented to person, place, and time. He appears well-developed and well-nourished.  HENT:  Head: Normocephalic and atraumatic.  Neck: Normal range of motion. Neck supple.  Cardiovascular: Normal rate and regular rhythm.   Pulmonary/Chest: Effort normal and breath sounds normal.  Musculoskeletal:  Normal Muscle Bulk and Muscle testing Reveals: Upper Extremities: Full ROM and Muscle strength 5/5 Lumbar Paraspinal Tenderness: L-3- L-5 Lower Extremities: Full ROM and Muscle strength 5/5 Arises from chair with ease Narrow based gait  Neurological: He is alert and oriented to person, place, and time.  Skin: Skin is warm and dry.  Psychiatric: He has a normal mood and affect.  Nursing note and vitals reviewed.         Assessment & Plan:  1. 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 Patient has adjacent level degenerative changes at L3-4 and L5-S1.  Refilled: Nucynta 50 mg one tablet every 6 hours as needed #120. Continue Lyrica  50 mg daily for left L5 distribution radicular pain.  2. Muscle Spasms: Continue Baclofen 10 mg BID #60.   15 minutes of face to face patient care time was spent during this visit. All questions were encouraged and answered.   F/U in 1 month

## 2014-01-16 ENCOUNTER — Ambulatory Visit: Payer: Self-pay

## 2014-01-16 ENCOUNTER — Encounter: Payer: Self-pay | Admitting: Registered Nurse

## 2014-01-16 ENCOUNTER — Other Ambulatory Visit: Payer: Self-pay | Admitting: Physical Medicine & Rehabilitation

## 2014-01-16 ENCOUNTER — Encounter: Payer: Worker's Compensation | Attending: Registered Nurse | Admitting: Registered Nurse

## 2014-01-16 VITALS — BP 140/73 | HR 84 | Resp 14

## 2014-01-16 DIAGNOSIS — Z5181 Encounter for therapeutic drug level monitoring: Secondary | ICD-10-CM

## 2014-01-16 DIAGNOSIS — Z79899 Other long term (current) drug therapy: Secondary | ICD-10-CM

## 2014-01-16 DIAGNOSIS — Z87891 Personal history of nicotine dependence: Secondary | ICD-10-CM | POA: Diagnosis not present

## 2014-01-16 DIAGNOSIS — G8929 Other chronic pain: Secondary | ICD-10-CM | POA: Insufficient documentation

## 2014-01-16 DIAGNOSIS — M5137 Other intervertebral disc degeneration, lumbosacral region: Secondary | ICD-10-CM

## 2014-01-16 DIAGNOSIS — I10 Essential (primary) hypertension: Secondary | ICD-10-CM | POA: Insufficient documentation

## 2014-01-16 DIAGNOSIS — M79604 Pain in right leg: Secondary | ICD-10-CM | POA: Insufficient documentation

## 2014-01-16 DIAGNOSIS — M62838 Other muscle spasm: Secondary | ICD-10-CM | POA: Diagnosis not present

## 2014-01-16 DIAGNOSIS — M545 Low back pain: Secondary | ICD-10-CM | POA: Insufficient documentation

## 2014-01-16 DIAGNOSIS — M5116 Intervertebral disc disorders with radiculopathy, lumbar region: Secondary | ICD-10-CM | POA: Insufficient documentation

## 2014-01-16 DIAGNOSIS — Z760 Encounter for issue of repeat prescription: Secondary | ICD-10-CM | POA: Insufficient documentation

## 2014-01-16 DIAGNOSIS — M79605 Pain in left leg: Secondary | ICD-10-CM | POA: Insufficient documentation

## 2014-01-16 MED ORDER — BACLOFEN 10 MG PO TABS: 10.0000 mg | ORAL_TABLET | Freq: Two times a day (BID) | ORAL | Status: DC

## 2014-01-16 MED ORDER — TAPENTADOL HCL 50 MG PO TABS: 50.0000 mg | ORAL_TABLET | Freq: Four times a day (QID) | ORAL | Status: DC | PRN

## 2014-01-16 MED ORDER — PREGABALIN 50 MG PO CAPS: 50.0000 mg | ORAL_CAPSULE | Freq: Every day | ORAL | Status: DC

## 2014-01-16 NOTE — Progress Notes (Signed)
Subjective:    Patient ID: Eric Casey, male    DOB: 11-Mar-1975, 39 y.o.   MRN: [id number]  HPI: Eric Casey is a 39 year old male who returns for follow up for chronic pain and medication refill. He says his pain is located in his lower back. He rates his pain 6. His current exercise regime is performing core exercises, push ups and stretching exercises.  Pain Inventory Average Pain 6 Pain Right Now 6 My pain is sharp, stabbing and tingling  In the last 24 hours, has pain interfered with the following? General activity 6 Relation with others 6 Enjoyment of life 6 What TIME of day is your pain at its worst? evening and night Sleep (in general) NA  Pain is worse with: walking, standing and some activites Pain improves with: rest, heat/ice and medication Relief from Meds: n/a  Mobility walk without assistance how many minutes can you walk? 60 ability to climb steps?  yes  Function Do you have any goals in this area?  no  Neuro/Psych numbness spasms  Prior Studies Any changes since last visit?  no  Physicians involved in your care Any changes since last visit?  no   Family History  Problem Relation Age of Onset  . Heart disease Father   . Hypertension Father    History   Social History  . Marital Status: Married    Spouse Name: N/A    Number of Children: N/A  . Years of Education: N/A   Social History Main Topics  . Smoking status: Former Smoker    Types: Cigarettes    Quit date: 04/15/2006  . Smokeless tobacco: Never Used  . Alcohol Use: No  . Drug Use: No  . Sexual Activity: None   Other Topics Concern  . None   Social History Narrative   History reviewed. No pertinent past surgical history. Past Medical History  Diagnosis Date  . High blood pressure   . Back pain, chronic   . Buttock pain    BP 140/73 mmHg  Pulse 84  Resp 14  SpO2 94%  Opioid Risk Score:   Fall Risk Score: Low Fall Risk (0-5 points) (previously  educated and given handout)  Review of Systems  Musculoskeletal:       Spasms  Neurological: Positive for numbness.  All other systems reviewed and are negative.      Objective:   Physical Exam  Constitutional: He is oriented to person, place, and time. He appears well-developed and well-nourished.  HENT:  Head: Normocephalic and atraumatic.  Neck: Normal range of motion. Neck supple.  Musculoskeletal:  Normal Muscle Bulk and Muscle Testing reveals: Upper Extremities: Full ROM and Muscle strength 5/5 Lumbar Paraspinal Tenderness: L-3- L-5 Lower Extremities: Full ROM and Muscle strength 5/5 Arises from chair with ease Narrow based gait  Neurological: He is alert and oriented to person, place, and time.  Skin: Skin is warm and dry.  Psychiatric: He has a normal mood and affect.  Nursing note and vitals reviewed.         Assessment & Plan:  1. 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 Patient has adjacent level degenerative changes at L3-4 and L5-S1.  Refilled: Nucynta 50 mg one tablet every 6 hours as needed #120. Continue Lyrica 50 mg daily for left L5 distribution radicular pain.  2. Muscle Spasms: Continue Baclofen 10 mg BID #60.   15 minutes of face to face patient care time was  spent during this visit. All questions were encouraged and answered.   F/U in 1 month

## 2014-02-07 NOTE — Progress Notes (Signed)
Urine drug screen for this encounter is consistent for prescribed medication 

## 2014-02-10 ENCOUNTER — Telehealth: Payer: Self-pay | Admitting: *Deleted

## 2014-02-10 NOTE — Telephone Encounter (Signed)
I spoke with Mr Elon AlasMcCraken today about his urine drug screen and he is disputing results saying that he has not had a drop of alcohol in years and he is taking his medication and claims that our lab has made a mistake in the specimens.  I told him I would give this information to Dr Wynn BankerKirsteins and that we would get back with him but doubtful the outcome will change.  I talked with office manager Isidor HoltsJohnette Shultz and she instructed me to have [facility] run the specimen again as it is the only thing we can do.  I have [facility]. At that time [facility] discovered that it was indeed not his test, and the print out was for another patient.  I immediately called Mr Elon AlasMcCraken back and informed him that there was indeed a mistake made with the lab printout and that his test was consistent. He was relieved but still very upset because he knew that he had not had alcohol in years and was taking his medication.

## 2014-02-13 ENCOUNTER — Encounter: Payer: Self-pay | Admitting: Registered Nurse

## 2014-02-13 ENCOUNTER — Encounter: Payer: Worker's Compensation | Attending: Registered Nurse | Admitting: Registered Nurse

## 2014-02-13 VITALS — BP 126/70 | HR 82 | Resp 14

## 2014-02-13 DIAGNOSIS — G8929 Other chronic pain: Secondary | ICD-10-CM | POA: Insufficient documentation

## 2014-02-13 DIAGNOSIS — Z87891 Personal history of nicotine dependence: Secondary | ICD-10-CM | POA: Insufficient documentation

## 2014-02-13 DIAGNOSIS — M62838 Other muscle spasm: Secondary | ICD-10-CM | POA: Insufficient documentation

## 2014-02-13 DIAGNOSIS — M79605 Pain in left leg: Secondary | ICD-10-CM | POA: Diagnosis not present

## 2014-02-13 DIAGNOSIS — M79604 Pain in right leg: Secondary | ICD-10-CM | POA: Insufficient documentation

## 2014-02-13 DIAGNOSIS — M5116 Intervertebral disc disorders with radiculopathy, lumbar region: Secondary | ICD-10-CM | POA: Diagnosis not present

## 2014-02-13 DIAGNOSIS — Z79899 Other long term (current) drug therapy: Secondary | ICD-10-CM

## 2014-02-13 DIAGNOSIS — M5432 Sciatica, left side: Secondary | ICD-10-CM

## 2014-02-13 DIAGNOSIS — Z760 Encounter for issue of repeat prescription: Secondary | ICD-10-CM | POA: Insufficient documentation

## 2014-02-13 DIAGNOSIS — M545 Low back pain: Secondary | ICD-10-CM

## 2014-02-13 DIAGNOSIS — I10 Essential (primary) hypertension: Secondary | ICD-10-CM | POA: Diagnosis not present

## 2014-02-13 DIAGNOSIS — Z5181 Encounter for therapeutic drug level monitoring: Secondary | ICD-10-CM

## 2014-02-13 DIAGNOSIS — M5137 Other intervertebral disc degeneration, lumbosacral region: Secondary | ICD-10-CM

## 2014-02-13 MED ORDER — TAPENTADOL HCL 50 MG PO TABS: 50.0000 mg | ORAL_TABLET | Freq: Four times a day (QID) | ORAL | Status: DC | PRN

## 2014-02-13 NOTE — Progress Notes (Signed)
Subjective:    Patient ID: Eric Casey, male    DOB: 1975/01/05, 39 y.o.   MRN: [id number]  HPI: [name] is a 39 year old male who returns for follow up for chronic pain and medication refill. He says his pain is located in his lower back radiating posteriorly left lower extremity and left buttock (sacral). He rates his pain 7. He has not followed his usual exercise regime due to increase intensity of pain.  He states" two weeks ago, he was sitting on his porch leaned over to tie his boot when he experienced increase intensity of pain, he felt as though something snapped in his back.[name] He fell to the porch his daughter and wife had to drag him into the house. He wasn't able to stand he laid on the couch. His family eventually was able to help him to the bed. He stayed in bed for 4 days. He was using heat, exercise, baclofen and analgesics. He sent an e-mail to his workman's compensation worker to see if he could go to the ED. He didn't receive a return e-mail he stated till 4 days later.  Still complaining of increase intensity of pain. MRI ordered and referral to Dr. Shelle Iron his surgeon.  Pain Inventory Average Pain 6 Pain Right Now 7 My pain is constant, burning, stabbing and tingling  In the last 24 hours, has pain interfered with the following? General activity 7 Relation with others 7 Enjoyment of life 7 What TIME of day is your pain at its worst? daytimeand night   Sleep (in general) Fair  Pain is worse with: walking, standing and some activites Pain improves with: rest, heat/ice and medication Relief from Meds: 5  Mobility walk without assistance how many minutes can you walk? 60 ability to climb steps?  yes do you drive?  yes  Function disabled: date disabled .  Neuro/Psych numbness tingling spasms  Prior Studies Any changes since last visit?  no  Physicians involved in your care Any changes since last visit?  no   Family History    Problem Relation Age of Onset  . Heart disease Father   . Hypertension Father    History   Social History  . Marital Status: Married    Spouse Name: N/A  . Number of Children: N/A  . Years of Education: N/A   Social History Main Topics  . Smoking status: Former Smoker    Types: Cigarettes    Quit date: 04/15/2006  . Smokeless tobacco: Never Used  . Alcohol Use: No  . Drug Use: No  . Sexual Activity: Not on file   Other Topics Concern  . None   Social History Narrative   History reviewed. No pertinent past surgical history. Past Medical History  Diagnosis Date  . High blood pressure   . Back pain, chronic   . Buttock pain    BP 126/70 mmHg  Pulse 82  Resp 14  SpO2 97%  Opioid Risk Score:   Fall Risk Score: Low Fall Risk (0-5 points)  Review of Systems  Constitutional: Negative.   HENT: Negative.   Eyes: Negative.   Respiratory: Negative.   Cardiovascular: Negative.   Gastrointestinal: Negative.   Endocrine: Negative.   Genitourinary: Negative.   Musculoskeletal: Positive for myalgias, back pain and arthralgias.  Allergic/Immunologic: Negative.   Neurological: Positive for tremors.       Spasms, tingling  Hematological: Negative.   Psychiatric/Behavioral: Negative.        Objective:  Physical Exam  Constitutional: He is oriented to person, place, and time. He appears well-developed and well-nourished.  HENT:  Head: Normocephalic and atraumatic.  Neck: Normal range of motion. Neck supple.  Cardiovascular: Normal rate and regular rhythm.   Pulmonary/Chest: Effort normal and breath sounds normal.  Musculoskeletal:  Normal Muscle Bulk and Muscle Testing Reveals: Upper Extremities: Full ROM and Muscle Strength 5/5 Lumbar Paraspinal Tenderness: L-3- L-5 Lower Extremities: Full ROM and Muscle Strength 5/5 Arises from chair with ease Narrow Based Gait  Neurological: He is alert and oriented to person, place, and time.  Skin: Skin is warm and dry.   Psychiatric: He has a normal mood and affect.  Nursing note and vitals reviewed.         Assessment & Plan:  1. 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 Patient has adjacent level degenerative changes at L3-4 and L5-S1.  Refilled: Nucynta 50 mg one tablet every 6 hours as needed #120. Continue Lyrica 50 mg daily for left L5 distribution radicular pain.  2. Muscle Spasms: Continue Baclofen 10 mg BID #60.   20 minutes of face to face patient care time was spent during this visit. All questions were encouraged and answered.   F/U in 1 month

## 2014-02-17 ENCOUNTER — Ambulatory Visit: Payer: Self-pay | Admitting: Registered Nurse

## 2014-03-17 ENCOUNTER — Encounter: Payer: Worker's Compensation | Attending: Registered Nurse | Admitting: Registered Nurse

## 2014-03-17 ENCOUNTER — Encounter: Payer: Self-pay | Admitting: Registered Nurse

## 2014-03-17 VITALS — BP 121/61 | HR 76 | Resp 14

## 2014-03-17 DIAGNOSIS — M545 Low back pain, unspecified: Secondary | ICD-10-CM

## 2014-03-17 DIAGNOSIS — Z760 Encounter for issue of repeat prescription: Secondary | ICD-10-CM | POA: Diagnosis not present

## 2014-03-17 DIAGNOSIS — M62838 Other muscle spasm: Secondary | ICD-10-CM | POA: Insufficient documentation

## 2014-03-17 DIAGNOSIS — M5432 Sciatica, left side: Secondary | ICD-10-CM

## 2014-03-17 DIAGNOSIS — M5137 Other intervertebral disc degeneration, lumbosacral region: Secondary | ICD-10-CM

## 2014-03-17 DIAGNOSIS — G8929 Other chronic pain: Secondary | ICD-10-CM | POA: Diagnosis not present

## 2014-03-17 DIAGNOSIS — M5116 Intervertebral disc disorders with radiculopathy, lumbar region: Secondary | ICD-10-CM | POA: Diagnosis not present

## 2014-03-17 DIAGNOSIS — Z87891 Personal history of nicotine dependence: Secondary | ICD-10-CM | POA: Diagnosis not present

## 2014-03-17 DIAGNOSIS — M79605 Pain in left leg: Secondary | ICD-10-CM | POA: Insufficient documentation

## 2014-03-17 DIAGNOSIS — I10 Essential (primary) hypertension: Secondary | ICD-10-CM | POA: Diagnosis not present

## 2014-03-17 DIAGNOSIS — M79604 Pain in right leg: Secondary | ICD-10-CM | POA: Diagnosis not present

## 2014-03-17 DIAGNOSIS — Z79899 Other long term (current) drug therapy: Secondary | ICD-10-CM

## 2014-03-17 DIAGNOSIS — M51379 Other intervertebral disc degeneration, lumbosacral region without mention of lumbar back pain or lower extremity pain: Secondary | ICD-10-CM

## 2014-03-17 DIAGNOSIS — Z5181 Encounter for therapeutic drug level monitoring: Secondary | ICD-10-CM

## 2014-03-17 MED ORDER — TAPENTADOL HCL 50 MG PO TABS: 50.0000 mg | ORAL_TABLET | Freq: Four times a day (QID) | ORAL | Status: DC | PRN

## 2014-03-17 NOTE — Progress Notes (Signed)
Subjective:    Patient ID: Eric Casey, male    DOB: [birth year], 39 y.o.   MRN: [id number]  HPI: [name] is a 39 year old male who returns for follow up for chronic pain and medication refill. He says his pain is located in his lower back radiating posteriorly left lower extremity and left buttock (sacral). He rates his pain 7. His current exercise regime is walking.  He states he still having increased intensity of pain awaiting appointment with Dr. Shelle Iron. Had MRI Dr. Wynn Banker will look at disc and I will give him a call this week he verbalizes understanding. Case Manager in room all questions answered.  Pain Inventory Average Pain 6 Pain Right Now 7 My pain is burning, stabbing and aching  In the last 24 hours, has pain interfered with the following? General activity 6 Relation with others 6 Enjoyment of life 6 What TIME of day is your pain at its worst? morning, daytime, night Sleep (in general) Poor  Pain is worse with: walking, standing and some activites Pain improves with: rest, heat/ice and medication Relief from Meds: 6  Mobility walk without assistance how many minutes can you walk? 30 ability to climb steps?  yes do you drive?  yes Do you have any goals in this area?  no  Function Do you have any goals in this area?  no  Neuro/Psych tingling spasms  Prior Studies Any changes since last visit?  no  Physicians involved in your care Any changes since last visit?  no   Family History  Problem Relation Age of Onset  . Heart disease Father   . Hypertension Father    History   Social History  . Marital Status: Married    Spouse Name: N/A  . Number of Children: N/A  . Years of Education: N/A   Social History Main Topics  . Smoking status: Former Smoker    Types: Cigarettes    Quit date: 04/15/2006  . Smokeless tobacco: Never Used  . Alcohol Use: No  . Drug Use: No  . Sexual Activity: Not on file   Other Topics  Concern  . None   Social History Narrative   History reviewed. No pertinent past surgical history. Past Medical History  Diagnosis Date  . High blood pressure   . Back pain, chronic   . Buttock pain    There were no vitals taken for this visit.  Opioid Risk Score:   Fall Risk Score: Low Fall Risk (0-5 points) (patient declined pamphlet during todays visit)  Review of Systems  Neurological:       Tingling Spasms   All other systems reviewed and are negative.      Objective:   Physical Exam  Constitutional: He is oriented to person, place, and time. He appears well-developed and well-nourished.  HENT:  Head: Normocephalic and atraumatic.  Neck: Normal range of motion. Neck supple.  Cardiovascular: Normal rate, regular rhythm and normal heart sounds.   Pulmonary/Chest: Effort normal and breath sounds normal.  Musculoskeletal:  Normal Muscle Bulk and Muscle Testing Reveals: Upper Extremities: Full ROM and Muscle Strength 5/5 Lumbar Paraspinal Tenderness: L-3- L-5 Lower Extremities: Full ROM and Muscle Strength 5/5 Arises from chair with ease Narrow Based gait  Neurological: He is alert and oriented to person, place, and time.  Skin: Skin is warm and dry.  Psychiatric: He has a normal mood and affect.  Nursing note and vitals reviewed.  Assessment & Plan:  1. 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 Patient has adjacent level degenerative changes at L3-4 and L5-S1.  Refilled: Nucynta 50 mg one tablet every 6 hours as needed #120. Continue Lyrica 50 mg daily for left L5 distribution radicular pain.  2. Muscle Spasms: Continue Baclofen 10 mg BID #60.   20 minutes of face to face patient care time was spent during this visit. All questions were encouraged and answered.   F/U in 1 month

## 2014-03-18 ENCOUNTER — Telehealth: Payer: Self-pay | Admitting: Registered Nurse

## 2014-03-18 NOTE — Telephone Encounter (Signed)
I spoke to [name] this morning. Dr. Wynn BankerKirsteins reviewed his MRI,  Results reviewed with [name], he verbalizes understanding.

## 2014-04-15 ENCOUNTER — Encounter: Payer: Self-pay | Admitting: Registered Nurse

## 2014-04-15 ENCOUNTER — Encounter: Payer: Worker's Compensation | Attending: Registered Nurse | Admitting: Registered Nurse

## 2014-04-15 VITALS — BP 140/76 | HR 74 | Resp 14

## 2014-04-15 DIAGNOSIS — Z79899 Other long term (current) drug therapy: Secondary | ICD-10-CM

## 2014-04-15 DIAGNOSIS — Z87891 Personal history of nicotine dependence: Secondary | ICD-10-CM | POA: Diagnosis not present

## 2014-04-15 DIAGNOSIS — M79604 Pain in right leg: Secondary | ICD-10-CM | POA: Insufficient documentation

## 2014-04-15 DIAGNOSIS — M62838 Other muscle spasm: Secondary | ICD-10-CM | POA: Diagnosis not present

## 2014-04-15 DIAGNOSIS — G8929 Other chronic pain: Secondary | ICD-10-CM | POA: Diagnosis not present

## 2014-04-15 DIAGNOSIS — Z760 Encounter for issue of repeat prescription: Secondary | ICD-10-CM | POA: Insufficient documentation

## 2014-04-15 DIAGNOSIS — M5432 Sciatica, left side: Secondary | ICD-10-CM | POA: Diagnosis not present

## 2014-04-15 DIAGNOSIS — M545 Low back pain, unspecified: Secondary | ICD-10-CM

## 2014-04-15 DIAGNOSIS — I10 Essential (primary) hypertension: Secondary | ICD-10-CM | POA: Insufficient documentation

## 2014-04-15 DIAGNOSIS — M5137 Other intervertebral disc degeneration, lumbosacral region: Secondary | ICD-10-CM | POA: Diagnosis not present

## 2014-04-15 DIAGNOSIS — M5116 Intervertebral disc disorders with radiculopathy, lumbar region: Secondary | ICD-10-CM | POA: Insufficient documentation

## 2014-04-15 DIAGNOSIS — M79605 Pain in left leg: Secondary | ICD-10-CM | POA: Diagnosis not present

## 2014-04-15 DIAGNOSIS — Z5181 Encounter for therapeutic drug level monitoring: Secondary | ICD-10-CM

## 2014-04-15 MED ORDER — TAPENTADOL HCL 50 MG PO TABS: 50.0000 mg | ORAL_TABLET | Freq: Four times a day (QID) | ORAL | Status: DC | PRN

## 2014-04-15 NOTE — Progress Notes (Signed)
Subjective:    Patient ID: Eric Casey, male    DOB: 01/30/1975, 39 y.o.   MRN: [id number]  HPI: Eric Casey is a 39 year old male who returns for follow up for chronic pain and medication refill. He says his pain is located in his lower back radiating anteriorally into left lower extremity and left buttock (sacral). He rates his pain 7. His current exercise regime is walking.   Pain Inventory Average Pain 6 Pain Right Now 7 My pain is constant, sharp, stabbing and tingling  In the last 24 hours, has pain interfered with the following? General activity 6 Relation with others 6 Enjoyment of life 6 What TIME of day is your pain at its worst? daytime and night Sleep (in general) Fair  Pain is worse with: walking, sitting, standing and some activites Pain improves with: rest, heat/ice and medication Relief from Meds: 6  Mobility walk without assistance how many minutes can you walk? 30 ability to climb steps?  yes do you drive?  yes  Function disabled: date disabled .  Neuro/Psych tingling spasms  Prior Studies Any changes since last visit?  no  Physicians involved in your care Any changes since last visit?  no   Family History  Problem Relation Age of Onset  . Heart disease Father   . Hypertension Father    History   Social History  . Marital Status: Married    Spouse Name: N/A  . Number of Children: N/A  . Years of Education: N/A   Social History Main Topics  . Smoking status: Former Smoker    Types: Cigarettes    Quit date: 04/15/2006  . Smokeless tobacco: Never Used  . Alcohol Use: No  . Drug Use: No  . Sexual Activity: Not on file   Other Topics Concern  . None   Social History Narrative   No past surgical history on file. Past Medical History  Diagnosis Date  . High blood pressure   . Back pain, chronic   . Buttock pain    BP 140/76 mmHg  Pulse 74  Resp 14  SpO2 99%  Opioid Risk Score:   Fall Risk Score:  Low Fall Risk (0-5 points)`1  Depression screen PHQ 2/9  No flowsheet data found.    Review of Systems  Constitutional: Negative.   HENT: Negative.   Eyes: Negative.   Respiratory: Negative.   Cardiovascular: Negative.   Gastrointestinal: Negative.   Endocrine: Negative.   Genitourinary: Negative.   Musculoskeletal: Positive for myalgias, back pain and arthralgias.  Skin: Negative.   Allergic/Immunologic: Negative.   Neurological:       Tingling, spasms  Hematological: Negative.   Psychiatric/Behavioral: Negative.        Objective:   Physical Exam  Constitutional: He is oriented to person, place, and time. He appears well-developed and well-nourished.  HENT:  Head: Normocephalic and atraumatic.  Neck: Normal range of motion. Neck supple.  Cardiovascular: Normal rate and regular rhythm.   Pulmonary/Chest: Effort normal and breath sounds normal.  Musculoskeletal:  Normal Muscle Bulk and Muscle Testing Reveals: Upper Extremities: Full ROM and Muscle Strength 5/5 Lumbar Paraspinal Tenderness: L-3- L-5 Lower Extremities: Full ROM and Muscle Strength 5/5 Arises from chair with ease Narrow Based Gait   Neurological: He is alert and oriented to person, place, and time.  Skin: Skin is warm and dry.  Psychiatric: He has a normal mood and affect.  Nursing note and vitals reviewed.  Assessment & Plan:  1. 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 Patient has adjacent level degenerative changes at L3-4 and L5-S1.  Refilled: Nucynta 50 mg one tablet every 6 hours as needed #120. Continue Lyrica 50 mg daily for left L5 distribution radicular pain.  2. Muscle Spasms: Continue Baclofen 10 mg BID #60.   20 minutes of face to face patient care time was spent during this visit. All questions were encouraged and answered.   F/U in 1 month

## 2014-05-15 ENCOUNTER — Ambulatory Visit (HOSPITAL_BASED_OUTPATIENT_CLINIC_OR_DEPARTMENT_OTHER): Payer: Worker's Compensation | Admitting: Physical Medicine & Rehabilitation

## 2014-05-15 ENCOUNTER — Encounter: Payer: Worker's Compensation | Attending: Registered Nurse

## 2014-05-15 ENCOUNTER — Encounter: Payer: Self-pay | Admitting: Physical Medicine & Rehabilitation

## 2014-05-15 VITALS — BP 135/79 | HR 78 | Resp 14

## 2014-05-15 DIAGNOSIS — M79605 Pain in left leg: Secondary | ICD-10-CM | POA: Diagnosis not present

## 2014-05-15 DIAGNOSIS — G8929 Other chronic pain: Secondary | ICD-10-CM | POA: Diagnosis not present

## 2014-05-15 DIAGNOSIS — M545 Low back pain: Secondary | ICD-10-CM | POA: Insufficient documentation

## 2014-05-15 DIAGNOSIS — M5137 Other intervertebral disc degeneration, lumbosacral region: Secondary | ICD-10-CM | POA: Diagnosis not present

## 2014-05-15 DIAGNOSIS — Z760 Encounter for issue of repeat prescription: Secondary | ICD-10-CM | POA: Insufficient documentation

## 2014-05-15 DIAGNOSIS — M79604 Pain in right leg: Secondary | ICD-10-CM | POA: Diagnosis not present

## 2014-05-15 DIAGNOSIS — M62838 Other muscle spasm: Secondary | ICD-10-CM | POA: Diagnosis not present

## 2014-05-15 DIAGNOSIS — M5116 Intervertebral disc disorders with radiculopathy, lumbar region: Secondary | ICD-10-CM | POA: Insufficient documentation

## 2014-05-15 DIAGNOSIS — M5432 Sciatica, left side: Secondary | ICD-10-CM

## 2014-05-15 DIAGNOSIS — I10 Essential (primary) hypertension: Secondary | ICD-10-CM | POA: Insufficient documentation

## 2014-05-15 DIAGNOSIS — Z87891 Personal history of nicotine dependence: Secondary | ICD-10-CM | POA: Diagnosis not present

## 2014-05-15 MED ORDER — TAPENTADOL HCL 50 MG PO TABS: 50.0000 mg | ORAL_TABLET | ORAL | Status: DC | PRN

## 2014-05-15 MED ORDER — PREGABALIN 50 MG PO CAPS: 50.0000 mg | ORAL_CAPSULE | Freq: Every day | ORAL | Status: DC

## 2014-05-15 NOTE — Progress Notes (Signed)
Subjective:    Patient ID: Eric Casey, male    DOB: September 25, 1975, 39 y.o.   MRN: [id number]  HPI Patient returns today. I last saw him about a year ago. He complained of increasing left lower extremity pain starting in February when he bent over and had sudden onset of pain followed by about 3 or 4 days of bedrest secondary to severe pain. Pain generally diminished but still was more than his usual baseline. Patient has a history of chronic L5 radiculitis has been previously evaluated by surgery. He had a repeat MRI in March of this year which by the neuro radiologist reading was unchanged compared to 2013 however patient got an appointment with his neurosurgeon and neurosurgery felt there was a sufficient change to warrant microdiscectomy at L4-5 on the left side. Patient has had no bowel or bladder dysfunction there as been some vague weakness in the left lower extremity no falls. He has not had any foot drop. He has chronic numbness and has had reduced sensation in L4-L5 dermatomes for a number of years Pain Inventory Average Pain 6 Pain Right Now 7 My pain is burning, stabbing and tingling  In the last 24 hours, has pain interfered with the following? General activity 6 Relation with others 6 Enjoyment of life 6 What TIME of day is your pain at its worst? evening and night Sleep (in general) NA  Pain is worse with: walking and standing Pain improves with: rest, heat/ice and medication Relief from Meds: 6  Mobility walk without assistance Do you have any goals in this area?  no  Function Do you have any goals in this area?  no  Neuro/Psych tingling spasms  Prior Studies Any changes since last visit?  no  Physicians involved in your care Any changes since last visit?  no   Family History  Problem Relation Age of Onset  . Heart disease Father   . Hypertension Father    History   Social History  . Marital Status: Married    Spouse Name: N/A  . Number of  Children: N/A  . Years of Education: N/A   Social History Main Topics  . Smoking status: Former Smoker    Types: Cigarettes    Quit date: 04/15/2006  . Smokeless tobacco: Never Used  . Alcohol Use: No  . Drug Use: No  . Sexual Activity: Not on file   Other Topics Concern  . None   Social History Narrative   History reviewed. No pertinent past surgical history. Past Medical History  Diagnosis Date  . High blood pressure   . Back pain, chronic   . Buttock pain    BP 135/79 mmHg  Pulse 78  Resp 14  SpO2 97%  Opioid Risk Score:   Fall Risk Score: Low Fall Risk (0-5 points)`1  Depression screen PHQ 2/9  No flowsheet data found.   Review of Systems  Musculoskeletal:       Spasms  Neurological:       Tingling  All other systems reviewed and are negative.      Objective:   Physical Exam Gen. No acute distress Left L3 reduced, BIllateral L5 reduced pinprick,  DTR 2/4 bilateral patellar, achilles 1/4 Motor strength is 5/5 bilateral hip flexor and extensor , 4/5 left ankle dorsal flexor, 5/5 right ankle dorsiflexor Gait is without evidence toe drag or knee instability Back his 50% lumbar flexion, 0-25% extension 0-25% lateral bending. Negative straight leg raising Mood and affect is appropriate  Assessment & Plan:  1. History of lumbar degenerative disc mainly L4-5 with chronic L5 radiculitis has had some exacerbation of symptoms and MRI interpreted by neuroradiology is unchanged however neurosurgery feels there is some change.  Patient is scheduled for surgery 06/11/2014. Until that time will increase his Nucynta 50 mg to 5 times per day. Continue Lyrica 50 mg twice a day I offered to increase this medication however he does not want to make any changes so he can see what the difference is postoperatively . Did discuss that lumbar surgery is more likely to help with radiating pain rather than back pain. Also we discussed that given the duration of the symptoms  he may not get a full resolution of his left lower extremity discomfort.  Discussed with patient and his wife We discussed for postoperative pain management that he will need to get this from his neurosurgeon. If he requires chronic pain management once he has had adequate postop healing we would be happy to reevaluate him.

## 2014-05-15 NOTE — Patient Instructions (Signed)
Post operative  Pain management will be by neurosurgery. If you have continued chronic pain that requires further pain management please make an appointment once you are released by neurosurgery.

## 2014-05-22 ENCOUNTER — Telehealth: Payer: Self-pay | Admitting: *Deleted

## 2014-05-22 NOTE — Telephone Encounter (Signed)
Pt called, he is asking for us to send in 90 day refill to his mail order pharmacy. I consulted Sybil, she suggested I check with you 1st since the last time he had it filled was in August of 2015. Mail order pharmacy is [facility]

## 2014-05-22 NOTE — Telephone Encounter (Signed)
For which med?

## 2014-05-22 NOTE — Telephone Encounter (Signed)
Nucynta [facility].  fx # [phone]

## 2014-05-23 NOTE — Telephone Encounter (Signed)
Sorry Dr. Wynn BankerKirsteins, The medication he is requesting is Celebrex 200 mg, last ordered 08/14/2013

## 2014-05-26 ENCOUNTER — Other Ambulatory Visit: Payer: Self-pay | Admitting: *Deleted

## 2014-05-26 MED ORDER — CELECOXIB 200 MG PO CAPS: 200.0000 mg | ORAL_CAPSULE | Freq: Two times a day (BID) | ORAL | Status: DC

## 2014-05-26 NOTE — Telephone Encounter (Signed)
celebrex 200mg  one q24h, #90 3 mo supply, 1 RF

## 2014-05-26 NOTE — Telephone Encounter (Signed)
rx sent electronically, pt notified 

## 2016-08-31 ENCOUNTER — Other Ambulatory Visit: Payer: Self-pay | Admitting: Anesthesiology

## 2016-10-21 ENCOUNTER — Encounter (HOSPITAL_COMMUNITY): Payer: Self-pay

## 2016-10-21 ENCOUNTER — Ambulatory Visit (HOSPITAL_COMMUNITY): Admit: 2016-10-21 | Payer: Self-pay | Admitting: Anesthesiology

## 2016-10-21 SURGERY — INSERTION, SPINAL CORD STIMULATOR, LUMBAR
Anesthesia: Monitor Anesthesia Care

## 2016-11-14 ENCOUNTER — Other Ambulatory Visit: Payer: Self-pay | Admitting: Anesthesiology

## 2016-11-15 NOTE — Pre-Procedure Instructions (Signed)
Eric CourseChristopher Casey  11/15/2016      [facility] - [place], [place] - [id number] [place] [id number] Phone: [phone] Fax: [phone]  [facility] - [place], [place] - [address] [place] [address] Phone: [phone] Fax: [phone]  [facility] - [place], [place] - [address] [place] [id number] Phone: [phone] Fax: [phone]    Your procedure is scheduled on November 18, 2016.  [facility] at 1000 AM.  Call this number if you have problems the morning of surgery:  [phone]   Remember:  Do not eat food or drink liquids after midnight.  Take these medicines the morning of surgery with A SIP OF WATER baclofen (lioresal), diazepam (valium)-if needed, loratadine (claritin), pregabalin (lyrica), tapentadol (nucynta)-if needed.  STOP taking any celebrex (celecoxib), diclofenac (flector) patch, Aspirin (unless otherwise instructed by your surgeon), Aleve, Naproxen, Ibuprofen, Motrin, Advil, Goody's, BC's, all herbal medications, fish oil, and all vitamins  Continue all other medications as instructed by your physician except follow the above medication instructions before surgery   Do not wear jewelry, make-up or nail polish.  Do not wear lotions, powders, or perfumes, or deoderant.  Men may shave face and neck.  Do not bring valuables to the hospital.  [facility] is not responsible for any belongings or valuables.  Contacts, dentures or bridgework may not be worn into surgery.  Leave your suitcase in the car.  After surgery it may be brought to your room.  For patients admitted to the hospital, discharge time will be determined by your treatment team.  Patients discharged the day of surgery will not be allowed to drive home.   Special instructions:   [facility]- Preparing For Surgery  Before surgery, you can  play an important role. Because skin is not sterile, your skin needs to be as free of germs as possible. You can reduce the number of germs on your skin by washing with CHG (chlorahexidine gluconate) Soap before surgery.  CHG is an antiseptic cleaner which kills germs and bonds with the skin to continue killing germs even after washing.  Please do not use if you have an allergy to CHG or antibacterial soaps. If your skin becomes reddened/irritated stop using the CHG.  Do not shave (including legs and underarms) for at least 48 hours prior to first CHG shower. It is OK to shave your face.  Please follow these instructions carefully.   1. Shower the NIGHT BEFORE SURGERY and the MORNING OF SURGERY with CHG.   2. If you chose to wash your hair, wash your hair first as usual with your normal shampoo.  3. After you shampoo, rinse your hair and body thoroughly to remove the shampoo.  4. Use CHG as you would any other liquid soap. You can apply CHG directly to the skin and wash gently with a scrungie or a clean washcloth.   5. Apply the CHG Soap to your body ONLY FROM THE NECK DOWN.  Do not use on open wounds or open sores. Avoid contact with your eyes, ears, mouth and genitals (private parts). Wash Face and genitals (private parts)  with your normal soap.  6. Wash thoroughly, paying special attention to the area where your surgery will be performed.  7. Thoroughly rinse your body with warm water from the neck down.  8. DO NOT  shower/wash with your normal soap after using and rinsing off the CHG Soap.  9. Pat yourself dry with a CLEAN TOWEL.  10. Wear CLEAN PAJAMAS to bed the night before surgery, wear comfortable clothes the morning of surgery  11. Place CLEAN SHEETS on your bed the night of your first shower and DO NOT SLEEP WITH PETS.    Day of Surgery: Do not apply any deodorants/lotions. Please wear clean clothes to the hospital/surgery center.     Please read over the following fact  sheets that you were given. Pain Booklet, Coughing and Deep Breathing and Surgical Site Infection Prevention

## 2016-11-16 ENCOUNTER — Encounter (HOSPITAL_COMMUNITY)
Admission: RE | Admit: 2016-11-16 | Discharge: 2016-11-16 | Disposition: A | Discharge status: Home / Self Care | Payer: Worker's Compensation | Source: Ambulatory Visit | Attending: Anesthesiology | Admitting: Anesthesiology

## 2016-11-16 ENCOUNTER — Encounter (HOSPITAL_COMMUNITY): Payer: Self-pay

## 2016-11-16 ENCOUNTER — Other Ambulatory Visit: Payer: Self-pay

## 2016-11-16 DIAGNOSIS — Z01810 Encounter for preprocedural cardiovascular examination: Secondary | ICD-10-CM | POA: Insufficient documentation

## 2016-11-16 DIAGNOSIS — M961 Postlaminectomy syndrome, not elsewhere classified: Secondary | ICD-10-CM | POA: Insufficient documentation

## 2016-11-16 DIAGNOSIS — Z01818 Encounter for other preprocedural examination: Secondary | ICD-10-CM | POA: Insufficient documentation

## 2016-11-16 HISTORY — DX: Adverse effect of unspecified anesthetic, initial encounter: T41.45XA

## 2016-11-16 HISTORY — DX: Nausea with vomiting, unspecified: R11.2

## 2016-11-16 HISTORY — DX: Other complications of anesthesia, initial encounter: T88.59XA

## 2016-11-16 HISTORY — DX: Anxiety disorder, unspecified: F41.9

## 2016-11-16 HISTORY — DX: Other specified postprocedural states: Z98.890

## 2016-11-16 LAB — SURGICAL PCR SCREEN
MRSA, PCR: NEGATIVE
STAPHYLOCOCCUS AUREUS: NEGATIVE

## 2016-11-16 LAB — TYPE AND SCREEN
ABO/RH(D): B POS
Antibody Screen: NEGATIVE

## 2016-11-16 LAB — CBC
HCT: 44.6 % (ref 39.0–52.0)
Hemoglobin: 16 g/dL (ref 13.0–17.0)
MCH: 30.8 pg (ref 26.0–34.0)
MCHC: 35.9 g/dL (ref 30.0–36.0)
MCV: 85.9 fL (ref 78.0–100.0)
Platelets: 260 10*3/uL (ref 150–400)
RBC: 5.19 MIL/uL (ref 4.22–5.81)
RDW: 12.8 % (ref 11.5–15.5)
WBC: 5.3 10*3/uL (ref 4.0–10.5)

## 2016-11-16 LAB — ABO/RH: ABO/RH(D): B POS

## 2016-11-16 LAB — BASIC METABOLIC PANEL
Anion gap: 9 (ref 5–15)
BUN: 10 mg/dL (ref 6–20)
CALCIUM: 9.8 mg/dL (ref 8.9–10.3)
CO2: 26 mmol/L (ref 22–32)
Chloride: 104 mmol/L (ref 101–111)
Creatinine, Ser: 1.16 mg/dL (ref 0.61–1.24)
GFR calc Af Amer: >60 mL/min (ref >60)
GLUCOSE: 89 mg/dL (ref 65–99)
Potassium: 4 mmol/L (ref 3.5–5.1)
Sodium: 139 mmol/L (ref 135–145)

## 2016-11-16 LAB — APTT: aPTT: 31 seconds (ref 24–36)

## 2016-11-16 LAB — PROTIME-INR
INR: 1.03
Prothrombin Time: 13.4 seconds (ref 11.4–15.2)

## 2016-11-16 NOTE — Progress Notes (Signed)
PCP - Dr. Konrad FelixBrad Thomas Cardiologist - n/a  Chest x-ray - n/a EKG - 11/16/2016 Stress Test - patient denies ECHO - patient denies Cardiac Cath - patient denies  Sleep Study - patient denies; scored 5 on stop bang, sent to PCP   Blood Thinner Instructions:n/a Aspirin Instructions:n/a   Patient denies shortness of breath, fever, cough and chest pain at PAT appointment   Patient verbalized understanding of instructions that were given to them at the PAT appointment. Patient was also instructed that they will need to review over the PAT instructions again at home before surgery.

## 2016-11-16 NOTE — Progress Notes (Signed)
   11/16/16 1156  OBSTRUCTIVE SLEEP APNEA  Have you ever been diagnosed with sleep apnea through a sleep study? No  Do you snore loudly (loud enough to be heard through closed doors)?  1  Do you often feel tired, fatigued, or sleepy during the daytime (such as falling asleep during driving or talking to someone)? 0  Has anyone observed you stop breathing during your sleep? 0  Do you have, or are you being treated for high blood pressure? 1  BMI more than 35 kg/m2? 1  Age > 50 (1-yes) 0  Neck circumference greater than:Male 16 inches or larger, Male 17inches or larger? 1  Male Gender (Yes=1) 1  Obstructive Sleep Apnea Score 5

## 2016-11-17 MED ORDER — CEFAZOLIN SODIUM-DEXTROSE 2-4 GM/100ML-% IV SOLN
2.0000 g | INTRAVENOUS | Status: AC
  Administered 2016-11-18: 2 g via INTRAVENOUS
  Filled 2016-11-17: qty 100

## 2016-11-18 ENCOUNTER — Encounter (HOSPITAL_COMMUNITY)
Admission: RE | Disposition: A | Discharge status: Home / Self Care | Payer: Self-pay | Source: Ambulatory Visit | Attending: Anesthesiology

## 2016-11-18 ENCOUNTER — Encounter (HOSPITAL_COMMUNITY): Payer: Self-pay | Admitting: *Deleted

## 2016-11-18 ENCOUNTER — Ambulatory Visit (HOSPITAL_COMMUNITY): Payer: Worker's Compensation

## 2016-11-18 ENCOUNTER — Ambulatory Visit (HOSPITAL_COMMUNITY): Payer: Worker's Compensation | Admitting: Certified Registered Nurse Anesthetist

## 2016-11-18 ENCOUNTER — Ambulatory Visit (HOSPITAL_COMMUNITY)
Admission: RE | Admit: 2016-11-18 | Discharge: 2016-11-18 | Disposition: A | Discharge status: Home / Self Care | Payer: Worker's Compensation | Source: Ambulatory Visit | Attending: Anesthesiology | Admitting: Anesthesiology

## 2016-11-18 DIAGNOSIS — M961 Postlaminectomy syndrome, not elsewhere classified: Secondary | ICD-10-CM | POA: Diagnosis not present

## 2016-11-18 DIAGNOSIS — Z9889 Other specified postprocedural states: Secondary | ICD-10-CM | POA: Diagnosis not present

## 2016-11-18 DIAGNOSIS — F419 Anxiety disorder, unspecified: Secondary | ICD-10-CM | POA: Diagnosis not present

## 2016-11-18 DIAGNOSIS — Z888 Allergy status to other drugs, medicaments and biological substances status: Secondary | ICD-10-CM | POA: Diagnosis not present

## 2016-11-18 DIAGNOSIS — Z87891 Personal history of nicotine dependence: Secondary | ICD-10-CM | POA: Diagnosis not present

## 2016-11-18 DIAGNOSIS — Z419 Encounter for procedure for purposes other than remedying health state, unspecified: Secondary | ICD-10-CM

## 2016-11-18 DIAGNOSIS — Z8249 Family history of ischemic heart disease and other diseases of the circulatory system: Secondary | ICD-10-CM | POA: Diagnosis not present

## 2016-11-18 DIAGNOSIS — Z885 Allergy status to narcotic agent status: Secondary | ICD-10-CM | POA: Insufficient documentation

## 2016-11-18 DIAGNOSIS — G894 Chronic pain syndrome: Secondary | ICD-10-CM | POA: Insufficient documentation

## 2016-11-18 DIAGNOSIS — M5416 Radiculopathy, lumbar region: Secondary | ICD-10-CM | POA: Insufficient documentation

## 2016-11-18 DIAGNOSIS — Z79899 Other long term (current) drug therapy: Secondary | ICD-10-CM | POA: Diagnosis not present

## 2016-11-18 HISTORY — PX: SPINAL CORD STIMULATOR INSERTION: SHX5378

## 2016-11-18 SURGERY — INSERTION, SPINAL CORD STIMULATOR, LUMBAR
Anesthesia: Monitor Anesthesia Care

## 2016-11-18 MED ORDER — BACITRACIN-NEOMYCIN-POLYMYXIN OINTMENT TUBE
TOPICAL_OINTMENT | CUTANEOUS | Status: DC | PRN
  Administered 2016-11-18: 1 via TOPICAL

## 2016-11-18 MED ORDER — EPHEDRINE 5 MG/ML INJ
INTRAVENOUS | Status: AC
  Filled 2016-11-18: qty 10

## 2016-11-18 MED ORDER — PHENYLEPHRINE 40 MCG/ML (10ML) SYRINGE FOR IV PUSH (FOR BLOOD PRESSURE SUPPORT)
PREFILLED_SYRINGE | INTRAVENOUS | Status: AC
  Filled 2016-11-18: qty 10

## 2016-11-18 MED ORDER — SUGAMMADEX SODIUM 200 MG/2ML IV SOLN
INTRAVENOUS | Status: AC
  Filled 2016-11-18: qty 2

## 2016-11-18 MED ORDER — LACTATED RINGERS IV SOLN
INTRAVENOUS | Status: DC
  Administered 2016-11-18: 11:00:00 via INTRAVENOUS

## 2016-11-18 MED ORDER — BUPIVACAINE-EPINEPHRINE (PF) 0.5% -1:200000 IJ SOLN
INTRAMUSCULAR | Status: DC | PRN
  Administered 2016-11-18: 25 mL via PERINEURAL

## 2016-11-18 MED ORDER — OXYCODONE HCL 5 MG PO TABS
5.0000 mg | ORAL_TABLET | Freq: Once | ORAL | Status: AC | PRN
  Administered 2016-11-18: 5 mg via ORAL

## 2016-11-18 MED ORDER — DOUBLE ANTIBIOTIC 500-10000 UNIT/GM EX OINT
TOPICAL_OINTMENT | CUTANEOUS | Status: AC
  Filled 2016-11-18: qty 1

## 2016-11-18 MED ORDER — BUPIVACAINE-EPINEPHRINE (PF) 0.5% -1:200000 IJ SOLN
INTRAMUSCULAR | Status: AC
  Filled 2016-11-18: qty 30

## 2016-11-18 MED ORDER — ROCURONIUM BROMIDE 10 MG/ML (PF) SYRINGE
PREFILLED_SYRINGE | INTRAVENOUS | Status: AC
  Filled 2016-11-18: qty 5

## 2016-11-18 MED ORDER — PROPOFOL 500 MG/50ML IV EMUL
INTRAVENOUS | Status: DC | PRN
  Administered 2016-11-18: 50 ug/kg/min via INTRAVENOUS

## 2016-11-18 MED ORDER — ONDANSETRON HCL 4 MG/2ML IJ SOLN
INTRAMUSCULAR | Status: DC | PRN
  Administered 2016-11-18: 4 mg via INTRAVENOUS

## 2016-11-18 MED ORDER — HYDROMORPHONE HCL 1 MG/ML IJ SOLN
INTRAMUSCULAR | Status: AC
  Filled 2016-11-18: qty 1

## 2016-11-18 MED ORDER — CHLORHEXIDINE GLUCONATE CLOTH 2 % EX PADS: 6.0000 | MEDICATED_PAD | Freq: Once | CUTANEOUS | Status: DC

## 2016-11-18 MED ORDER — 0.9 % SODIUM CHLORIDE (POUR BTL) OPTIME
TOPICAL | Status: DC | PRN
  Administered 2016-11-18: 1000 mL

## 2016-11-18 MED ORDER — FENTANYL CITRATE (PF) 250 MCG/5ML IJ SOLN
INTRAMUSCULAR | Status: AC
  Filled 2016-11-18: qty 5

## 2016-11-18 MED ORDER — MEPERIDINE HCL 25 MG/ML IJ SOLN: 6.2500 mg | INTRAMUSCULAR | Status: DC | PRN

## 2016-11-18 MED ORDER — OXYCODONE HCL 5 MG/5ML PO SOLN: 5.0000 mg | Freq: Once | ORAL | Status: AC | PRN

## 2016-11-18 MED ORDER — MIDAZOLAM HCL 5 MG/5ML IJ SOLN
INTRAMUSCULAR | Status: DC | PRN
  Administered 2016-11-18: 2 mg via INTRAVENOUS

## 2016-11-18 MED ORDER — FENTANYL CITRATE (PF) 100 MCG/2ML IJ SOLN
INTRAMUSCULAR | Status: DC | PRN
  Administered 2016-11-18 (×2): 50 ug via INTRAVENOUS
  Administered 2016-11-18 (×2): 25 ug via INTRAVENOUS
  Administered 2016-11-18 (×2): 50 ug via INTRAVENOUS

## 2016-11-18 MED ORDER — HYDROMORPHONE HCL 1 MG/ML IJ SOLN
0.2500 mg | INTRAMUSCULAR | Status: DC | PRN
  Administered 2016-11-18 (×2): 0.5 mg via INTRAVENOUS

## 2016-11-18 MED ORDER — PROPOFOL 10 MG/ML IV BOLUS
INTRAVENOUS | Status: DC | PRN
  Administered 2016-11-18: 30 mg via INTRAVENOUS

## 2016-11-18 MED ORDER — LIDOCAINE 2% (20 MG/ML) 5 ML SYRINGE
INTRAMUSCULAR | Status: AC
  Filled 2016-11-18: qty 5

## 2016-11-18 MED ORDER — SODIUM CHLORIDE 0.9 % IR SOLN
Status: DC | PRN
  Administered 2016-11-18: 11:00:00

## 2016-11-18 MED ORDER — PROPOFOL 10 MG/ML IV BOLUS
INTRAVENOUS | Status: AC
  Filled 2016-11-18: qty 20

## 2016-11-18 MED ORDER — DEXAMETHASONE SODIUM PHOSPHATE 10 MG/ML IJ SOLN
INTRAMUSCULAR | Status: AC
  Filled 2016-11-18: qty 1

## 2016-11-18 MED ORDER — CLINDAMYCIN HCL 150 MG PO CAPS: 150.0000 mg | ORAL_CAPSULE | Freq: Three times a day (TID) | ORAL | 0 refills | Status: AC

## 2016-11-18 MED ORDER — OXYCODONE-ACETAMINOPHEN 10-325 MG PO TABS: 1.0000 | ORAL_TABLET | ORAL | 0 refills | Status: DC | PRN

## 2016-11-18 MED ORDER — SUCCINYLCHOLINE CHLORIDE 200 MG/10ML IV SOSY
PREFILLED_SYRINGE | INTRAVENOUS | Status: AC
  Filled 2016-11-18: qty 10

## 2016-11-18 MED ORDER — OXYCODONE HCL 5 MG PO TABS
ORAL_TABLET | ORAL | Status: AC
  Filled 2016-11-18: qty 1

## 2016-11-18 MED ORDER — ONDANSETRON HCL 4 MG/2ML IJ SOLN
INTRAMUSCULAR | Status: AC
  Filled 2016-11-18: qty 2

## 2016-11-18 MED ORDER — MIDAZOLAM HCL 2 MG/2ML IJ SOLN
INTRAMUSCULAR | Status: AC
  Filled 2016-11-18: qty 2

## 2016-11-18 MED ORDER — PROMETHAZINE HCL 25 MG/ML IJ SOLN: 6.2500 mg | INTRAMUSCULAR | Status: DC | PRN

## 2016-11-18 SURGICAL SUPPLY — 67 items
ADH SKN CLS APL DERMABOND .7 (GAUZE/BANDAGES/DRESSINGS)
ANCH LD 4 SETX2 CLIK X (Stimulator) ×1 IMPLANT
ANCHOR CLIK X NEURO (Stimulator) ×1 IMPLANT
APL SKNCLS STERI-STRIP NONHPOA (GAUZE/BANDAGES/DRESSINGS)
BAG DECANTER FOR FLEXI CONT (MISCELLANEOUS) ×2 IMPLANT
BENZOIN TINCTURE PRP APPL 2/3 (GAUZE/BANDAGES/DRESSINGS) IMPLANT
BINDER ABDOMINAL 12 ML 46-62 (SOFTGOODS) ×2 IMPLANT
BLADE CLIPPER SURG (BLADE) IMPLANT
CABLE OR STIMULATOR 2X8 61 (WIRE) ×2 IMPLANT
CHLORAPREP W/TINT 26ML (MISCELLANEOUS) ×2 IMPLANT
CLIP VESOCCLUDE SM WIDE 6/CT (CLIP) IMPLANT
DERMABOND ADVANCED (GAUZE/BANDAGES/DRESSINGS)
DERMABOND ADVANCED .7 DNX12 (GAUZE/BANDAGES/DRESSINGS) ×1 IMPLANT
DRAPE C-ARM 42X72 X-RAY (DRAPES) ×2 IMPLANT
DRAPE C-ARMOR (DRAPES) ×2 IMPLANT
DRAPE LAPAROTOMY 100X72X124 (DRAPES) ×2 IMPLANT
DRAPE POUCH INSTRU U-SHP 10X18 (DRAPES) ×2 IMPLANT
DRAPE SURG 17X23 STRL (DRAPES) ×2 IMPLANT
DRSG OPSITE POSTOP 3X4 (GAUZE/BANDAGES/DRESSINGS) ×2 IMPLANT
ELECT REM PT RETURN 9FT ADLT (ELECTROSURGICAL) ×2
ELECTRODE REM PT RTRN 9FT ADLT (ELECTROSURGICAL) ×1 IMPLANT
GAUZE SPONGE 4X4 16PLY XRAY LF (GAUZE/BANDAGES/DRESSINGS) ×2 IMPLANT
GENERATOR NEUROSTIMULATOR (Neurostimulator) ×1 IMPLANT
GLOVE BIOGEL PI IND STRL 7.5 (GLOVE) ×1 IMPLANT
GLOVE BIOGEL PI INDICATOR 7.5 (GLOVE) ×1
GLOVE ECLIPSE 7.5 STRL STRAW (GLOVE) ×2 IMPLANT
GLOVE EXAM NITRILE LRG STRL (GLOVE) IMPLANT
GLOVE EXAM NITRILE XL STR (GLOVE) IMPLANT
GLOVE EXAM NITRILE XS STR PU (GLOVE) IMPLANT
GOWN STRL REUS W/ TWL LRG LVL3 (GOWN DISPOSABLE) IMPLANT
GOWN STRL REUS W/ TWL XL LVL3 (GOWN DISPOSABLE) IMPLANT
GOWN STRL REUS W/TWL 2XL LVL3 (GOWN DISPOSABLE) IMPLANT
GOWN STRL REUS W/TWL LRG LVL3 (GOWN DISPOSABLE) ×4
GOWN STRL REUS W/TWL XL LVL3 (GOWN DISPOSABLE)
KIT BASIN OR (CUSTOM PROCEDURE TRAY) ×2 IMPLANT
KIT CHARGING (KITS) ×1
KIT CHARGING PRECISION NEURO (KITS) IMPLANT
KIT REMOTE CONTROL 112802 FREE (KITS) ×1 IMPLANT
KIT ROOM TURNOVER OR (KITS) ×2 IMPLANT
LEAD INFINION CX PERC 70CM (Cable) ×2 IMPLANT
NDL 18GX1X1/2 (RX/OR ONLY) (NEEDLE) IMPLANT
NDL ENTRADA 4.5IN (NEEDLE) IMPLANT
NDL HYPO 25X1 1.5 SAFETY (NEEDLE) ×1 IMPLANT
NEEDLE 18GX1X1/2 (RX/OR ONLY) (NEEDLE) IMPLANT
NEEDLE ENTRADA 4.5IN (NEEDLE) ×4 IMPLANT
NEEDLE HYPO 25X1 1.5 SAFETY (NEEDLE) ×2 IMPLANT
NS IRRIG 1000ML POUR BTL (IV SOLUTION) ×2 IMPLANT
PACK LAMINECTOMY NEURO (CUSTOM PROCEDURE TRAY) ×2 IMPLANT
PAD ARMBOARD 7.5X6 YLW CONV (MISCELLANEOUS) ×2 IMPLANT
SPONGE LAP 4X18 X RAY DECT (DISPOSABLE) ×2 IMPLANT
SPONGE SURGIFOAM ABS GEL SZ50 (HEMOSTASIS) IMPLANT
STAPLER SKIN PROX WIDE 3.9 (STAPLE) ×2 IMPLANT
STRIP CLOSURE SKIN 1/2X4 (GAUZE/BANDAGES/DRESSINGS) IMPLANT
SUT MNCRL AB 4-0 PS2 18 (SUTURE) IMPLANT
SUT SILK 0 (SUTURE) ×2
SUT SILK 0 MO-6 18XCR BRD 8 (SUTURE) ×1 IMPLANT
SUT SILK 0 TIES 10X30 (SUTURE) IMPLANT
SUT SILK 2 0 TIES 10X30 (SUTURE) IMPLANT
SUT VIC AB 2-0 CP2 18 (SUTURE) ×4 IMPLANT
SYR 10ML LL (SYRINGE) IMPLANT
SYR EPIDURAL 5ML GLASS (SYRINGE) ×2 IMPLANT
TOOL LONG TUNNEL (SPINAL CORD STIMULATOR) ×1 IMPLANT
TOWEL GREEN STERILE (TOWEL DISPOSABLE) ×2 IMPLANT
TOWEL GREEN STERILE FF (TOWEL DISPOSABLE) ×2 IMPLANT
WATER STERILE IRR 1000ML POUR (IV SOLUTION) ×2 IMPLANT
WRENCH HEX 7.6 (SPINAL CORD STIMULATOR) IMPLANT
YANKAUER SUCT BULB TIP NO VENT (SUCTIONS) ×2 IMPLANT

## 2016-11-18 NOTE — Op Note (Signed)
PREOP DX: 1) lumbago  2) lumbar radiculopathy  3) lumbar post-laminectomy syndrome  4) chronic pain  POSTOP DX: 1) lumbago  2) lumbar radiculopathy  3) lumbar post-laminectomy syndrome  4) chronic pain PROCEDURES PERFORMED:1) intraop fluoro 2) placement of 2 16 contact boston scientific Infinion leads 3) placement of Spectra SCS generator  SURGEON:Ramone Gander  ASSISTANT: NONE  ANESTHESIA:MAC/TIVA EBL: <20cc  DESCRIPTION OF PROCEDURE: After a discussion of risks, benefits and alternatives, informed consent was obtained. The patient was taken to the OR,general anesthesia induced by the anesthesia team,turned prone onto a Jackson table, all pressure points padded, SCD's placed, and an adequate plane of anesthesia induced. A timeout was taken to verify the correct patient, position, personnel, availability of appropriate equipment, and administration of perioperative antibiotics.   The thoracic and lumbar areas were widely prepped with chloraprep and draped into a sterile field. Fluoroscopy was used to plan arightparamedian incision at theL1-L2 levels, and an incision made with a 10 blade and carried down to the dorsolumbar fascia with the bovie and blunt dissection. Retractors were placed and a 14g [employer] tuohy needle placed into the epidural space at the T12-L1 interspace using biplanar fluoro and loss-of-resistance technique. The needle was aspirated without any return of fluid. A [employer] INFINION lead was introduced and under live AP fluoro advanced until the2distal-most contactsoverlay the inferior aspect T7vertebral body shadow with the rest of the contacts distributed over the T8and T9 vertebral bodies in a position just at anatomic midline. A second Infinion lead was placed left of anatomic midline in the same levels using the same  technique. The patient was awakened, and coverage tested. Patient reported good coverage in all of his pain areas.   Adequate sedation was re-established, and 0 silk sutures were placed in the fascia adjacent to the needles. The needles and stylets were removed under fluoroscopy with no lead migration noted. Leads were then fixed to the fascia byClik anchorswith the sutures; repeat images were obtained to verify that there had been no lead migration.  The incision was inspected and hemostasis obtained with the bipolar cautery.  Attention was then turned to creation of a subcutaneous pocket. At therightflank, a 3 cm incision was made with a 10 blade and using the bovie and blunt dissection a pocket of size appropriate to place a SCS generator. The pocket was trialed, and found to be of adequate size. The pocket was inspected for hemostasis, which was found to be excellent. Using reverse seldinger technique, the leads were tunneled to the pocket site, and the leads inserted into the SCS generator. Impedances were checked, and all found to be excellent. The leads were then all fixed into position with a self-torquing wrench. The wiring was all carefully coiled, placed behind the generator and placed in the pocket.  Both incisions were copiously irrigated with bacitracin-containing irrigation. The lumbar incision was closed in 2 deep layers of interrupted 2-0 vicryl and the skin closed with staples. The pocket incision was closed with a deeper layer of 2-0 vicryl interrupted sutures, and the skin closed with staples. Sterile dressings were applied. Needle, sponge, and instrument counts were correct x2 at the end of the case.  The patient was then carefully awakened from anesthesia, turned supine, an abdominal binder placed, and the patient taken to the recovery room where he underwent complex spinal cord stimulator programming.  COMPLICATIONS: NONE  CONDITION: Stable throughout the course of the  procedure and immediately afterward  DISPOSITION: discharge to home, with antibiotics and   pain medicine. Discussed care with the patient and spouse. Followup in clinic will be scheduled in 10-14 days.    

## 2016-11-18 NOTE — Anesthesia Postprocedure Evaluation (Signed)
Anesthesia Post Note  Patient: Eric Casey  Procedure(s) Performed: LUMBAR SPINAL CORD STIMULATOR INSERTION (N/A )     Patient location during evaluation: PACU Anesthesia Type: MAC Level of consciousness: awake and alert Pain management: pain level controlled Vital Signs Assessment: post-procedure vital signs reviewed and stable Respiratory status: spontaneous breathing, nonlabored ventilation and respiratory function stable Cardiovascular status: stable and blood pressure returned to baseline Postop Assessment: no apparent nausea or vomiting Anesthetic complications: no    Last Vitals:  Vitals:   11/18/16 1344 11/18/16 1345  BP: 117/73   Pulse: 68 70  Resp: 14 15  Temp: (!) 36.1 C   SpO2: 97% 97%    Last Pain:  Vitals:   11/18/16 1355  TempSrc:   PainSc: [place]

## 2016-11-18 NOTE — Anesthesia Preprocedure Evaluation (Signed)
Anesthesia Evaluation  Patient identified by MRN, date of birth, ID band Patient awake    Reviewed: Allergy & Precautions, NPO status , Patient's Chart, lab work & pertinent test results  History of Anesthesia Complications (+) PONV  Airway Mallampati: II  TM Distance: >3 FB Neck ROM: Full    Dental no notable dental hx.    Pulmonary neg pulmonary ROS, former smoker,    Pulmonary exam normal breath sounds clear to auscultation       Cardiovascular hypertension, Pt. on medications negative cardio ROS Normal cardiovascular exam Rhythm:Regular Rate:Normal     Neuro/Psych Anxiety negative neurological ROS  negative psych ROS   GI/Hepatic negative GI ROS, Neg liver ROS,   Endo/Other  negative endocrine ROS  Renal/GU negative Renal ROS  negative genitourinary   Musculoskeletal negative musculoskeletal ROS (+) Arthritis , Osteoarthritis,    Abdominal (+) + obese,   Peds negative pediatric ROS (+)  Hematology negative hematology ROS (+)   Anesthesia Other Findings   Reproductive/Obstetrics negative OB ROS                             Anesthesia Physical Anesthesia Plan  ASA: II  Anesthesia Plan: MAC   Post-op Pain Management:    Induction: Intravenous  PONV Risk Score and Plan: 2 and Ondansetron, Midazolam and Treatment may vary due to age or medical condition  Airway Management Planned: Simple Face Mask  Additional Equipment:   Intra-op Plan:   Post-operative Plan:   Informed Consent: I have reviewed the patients History and Physical, chart, labs and discussed the procedure including the risks, benefits and alternatives for the proposed anesthesia with the patient or authorized representative who has indicated his/her understanding and acceptance.   Dental advisory given  Plan Discussed with: CRNA  Anesthesia Plan Comments:         Anesthesia Quick Evaluation

## 2016-11-18 NOTE — Transfer of Care (Signed)
Immediate Anesthesia Transfer of Care Note  Patient: Eric Casey  Procedure(s) Performed: LUMBAR SPINAL CORD STIMULATOR INSERTION (N/A )  Patient Location: PACU  Anesthesia Type:MAC  Level of Consciousness: awake, alert , oriented and patient cooperative  Airway & Oxygen Therapy: Patient Spontanous Breathing and Patient connected to nasal cannula oxygen  Post-op Assessment: Report given to RN and Post -op Vital signs reviewed and stable  Post vital signs: Reviewed and stable  Last Vitals:  Vitals:   11/18/16 1009 11/18/16 1310  BP: 129/78 (!) 145/52  Pulse: 88 74  Resp: 18 10  Temp: 37 C 36.5 C  SpO2: 97% 97%    Last Pain:  Vitals:   11/18/16 1050  TempSrc:   PainSc: 7       Patients Stated Pain Goal: 5 (36/62/94 [id number])  Complications: No apparent anesthesia complications

## 2016-11-18 NOTE — H&P (Signed)
Eric Casey is an 41 y.o. male.   Chief Complaint: back pain, leg pain HPI: injured at work, required lumbar surgery, still has back pain and radiculopathy. No further surgical intervention. Medical management and other intervention have yielded limited improvement in ability to function. Underwent SCS trial with 50% improvement in pain, function. Now presents for permanent implant.  Past Medical History:  Diagnosis Date  . Anxiety   . Back pain, chronic   . Buttock pain   . Complication of anesthesia   . High blood pressure   . PONV (postoperative nausea and vomiting)     Past Surgical History:  Procedure Laterality Date  . MICRODISCECTOMY LUMBAR    . PILONIDAL CYST EXCISION      Family History  Problem Relation Age of Onset  . Heart disease Father   . Hypertension Father    Social History:  reports that he quit smoking about 10 years ago. His smoking use included cigarettes. he has never used smokeless tobacco. He reports that he does not drink alcohol or use drugs.  Allergies:  Allergies  Allergen Reactions  . Codeine Hives  . Cymbalta [Duloxetine Hcl] Other (See Comments)    "Felt strange"  . Flexeril [Cyclobenzaprine] Other (See Comments)    sedation  . Robaxin [Methocarbamol] Nausea Only  . Zanaflex [Tizanidine Hcl] Nausea Only    Medications Prior to Admission  Medication Sig Dispense Refill  . baclofen (LIORESAL) 10 MG tablet Take 1 tablet (10 mg total) by mouth 2 (two) times daily. 60 tablet 3  . celecoxib (CELEBREX) 200 MG capsule Take 1 capsule (200 mg total) by mouth 2 (two) times daily. (Patient taking differently: Take 200 mg daily by mouth. ) 90 capsule 1  . diazepam (VALIUM) 5 MG tablet Take 5 mg every 12 (twelve) hours as needed by mouth for muscle spasms.    [name] loratadine (CLARITIN) 10 MG tablet Take 10 mg daily by mouth.    . losartan (COZAAR) 100 MG tablet Take 100 mg daily by mouth.    . Omega-3 Fatty Acids (FISH OIL) 1200 MG CAPS Take 1,200  mg daily by mouth.    . tapentadol (NUCYNTA) 50 MG TABS tablet Take 1 tablet (50 mg total) by mouth every 4 (four) hours as needed for severe pain. (Patient taking differently: Take 50-100 mg every 4 (four) hours as needed by mouth for severe pain (1-2 depends on pain level). ) 150 tablet 0  . diclofenac (FLECTOR) 1.3 % PTCH Place 1 patch onto the skin 2 (two) times daily. (Patient not taking: Reported on 11/09/2016) 180 patch 1  . pregabalin (LYRICA) 50 MG capsule Take 1 capsule (50 mg total) by mouth daily. (Patient not taking: Reported on 11/09/2016) 90 capsule 2    Results for orders placed or performed during the hospital encounter of 11/16/16 (from the past 48 hour(s))  Surgical pcr screen     Status: None   Collection Time: 11/16/16 11:47 AM  Result Value Ref Range   MRSA, PCR NEGATIVE NEGATIVE   Staphylococcus aureus NEGATIVE NEGATIVE    Comment: (NOTE) The Xpert SA Assay (FDA approved for NASAL specimens in patients 43 years of age and older), is one component of a comprehensive surveillance program. It is not intended to diagnose infection nor to guide or monitor treatment.   Basic metabolic panel     Status: None   Collection Time: 11/16/16 12:00 PM  Result Value Ref Range   Sodium 139 135 - 145 mmol/L  Potassium 4.0 3.5 - 5.1 mmol/L   Chloride 104 101 - 111 mmol/L   CO2 26 22 - 32 mmol/L   Glucose, Bld 89 65 - 99 mg/dL   BUN 10 6 - 20 mg/dL   Creatinine, Ser 1.16 0.61 - 1.24 mg/dL   Calcium 9.8 8.9 - 10.3 mg/dL   GFR calc non Af Amer >60 >60 mL/min   GFR calc Af Amer >60 >60 mL/min    Comment: (NOTE) The eGFR has been calculated using the CKD EPI equation. This calculation has not been validated in all clinical situations. eGFR's persistently <60 mL/min signify possible Chronic Kidney Disease.    Anion gap 9 5 - 15  CBC     Status: None   Collection Time: 11/16/16 12:00 PM  Result Value Ref Range   WBC 5.3 4.0 - 10.5 K/uL   RBC 5.19 4.22 - 5.81 MIL/uL    Hemoglobin 16.0 13.0 - 17.0 g/dL   HCT 44.6 39.0 - 52.0 %   MCV 85.9 78.0 - 100.0 fL   MCH 30.8 26.0 - 34.0 pg   MCHC 35.9 30.0 - 36.0 g/dL   RDW 12.8 11.5 - 15.5 %   Platelets 260 150 - 400 K/uL  APTT     Status: None   Collection Time: 11/16/16 12:00 PM  Result Value Ref Range   aPTT 31 24 - 36 seconds  Protime-INR     Status: None   Collection Time: 11/16/16 12:00 PM  Result Value Ref Range   Prothrombin Time 13.4 11.4 - 15.2 seconds   INR 1.03   Type and screen     Status: None   Collection Time: 11/16/16 12:10 PM  Result Value Ref Range   ABO/RH(D) B POS    Antibody Screen NEG    Sample Expiration 11/30/2016    Extend sample reason NO TRANSFUSIONS OR PREGNANCY IN THE PAST 3 MONTHS   ABO/Rh     Status: None   Collection Time: 11/16/16 12:10 PM  Result Value Ref Range   ABO/RH(D) B POS    No results found.  Review of Systems  Constitutional: Negative.   HENT: Negative.   Eyes: Negative.   Respiratory: Negative.   Cardiovascular: Negative.   Gastrointestinal: Negative.   Genitourinary: Negative.   Musculoskeletal: Positive for back pain. Negative for falls and myalgias.  Skin: Negative.   Neurological: Negative.   Endo/Heme/Allergies: Negative.   Psychiatric/Behavioral: Negative.     Blood pressure 129/78, pulse 88, temperature 98.6 F (37 C), temperature source Oral, resp. rate 18, height 5' 9"  (1.753 m), weight 116.3 kg (256 lb 8 oz), SpO2 97 %. Physical Exam  Constitutional: He is oriented to person, place, and time. He appears well-developed and well-nourished.  HENT:  Head: Normocephalic and atraumatic.  Eyes: EOM are normal. Pupils are equal, round, and reactive to light.  Neck: Normal range of motion.  Cardiovascular: Normal rate.  GI: Soft.  Musculoskeletal: Normal range of motion.  Neurological: He is alert and oriented to person, place, and time.  Skin: Skin is warm.  Psychiatric: He has a normal mood and affect. His behavior is normal. Judgment  and thought content normal.     Assessment/Plan Failed back syndrome, chronic pain syndrome PLAN: permanent SCS, [place], MD 11/18/2016, 11:19 AM

## 2016-11-18 NOTE — Discharge Instructions (Addendum)

## 2016-11-21 ENCOUNTER — Encounter (HOSPITAL_COMMUNITY): Payer: Self-pay | Admitting: Anesthesiology

## 2019-07-19 ENCOUNTER — Other Ambulatory Visit: Payer: Self-pay | Admitting: Neurosurgery

## 2019-10-08 NOTE — H&P (Signed)
Patient ID:   [id number] Patient: Eric Casey  Date of Birth: 04-07-1975 Visit Type: Office Visit   Date: 07/30/2019 01:00 PM Provider: Leonie Green NP   This 44 year old male presents for pain.  HISTORY OF PRESENT ILLNESS: 1.  pain  Eric Casey is a 44 year old male history significant for chronic back pain who presents to [facility] today after last being seen on May 07, 2019. The patient has been following every 3 months for continued medication management.  He did see Dr. Vertell Limber on February 3rd approximately 1 week before his appointment with pain management in which lumbar fusion surgery was recommended.  His worker's compensation has approved his surgery and is scheduled for October 18, 2019 Dr. Vertell Limber.  There has been no changes in his overall pain complaints.  Still having significant amount of back pain radiating down his legs and feet.  He continues to utilize baclofen 10 mg 1q 6 hours, gabapentin 300 mg 1 q.8 hours and Nucynta tapentalol 50 mg up to 6 per day.  He is unable to tolerate any long-acting analgesia.  States one of his major issues at this time is that he is not sleeping well. States that the pain does keep him awake particularly during the night and not getting a full night's sleep.  Did discuss his case today in the presence of his case manager Estill Bamberg.  This point I am recommending a low-dose trazodone 50 mg 1-2 at bedtime for the next month.  We will trial this medication to see if this might help alleviate some of his pain so that he is able to sleep.  We will also add diclofenac 50 mg 1 p.o. b.i.d. As an anti-inflammatory.  We did discuss today meeting prior to his surgery to discuss postoperative pain management.  At this point we are recommending that he he increase the dosage of his Nucynta which we will discuss closer to his surgery in early October.      Medical/Surgical/Interim History Reviewed, no change.  Last  detailed document date:11/20/2018.     PAST MEDICAL HISTORY, SURGICAL HISTORY, FAMILY HISTORY, SOCIAL HISTORY AND REVIEW OF SYSTEMS I have reviewed the patient's past medical, surgical, family and social history as well as the comprehensive review of systems as included on the [facility] history form dated 01/09/2019, which I have signed.  Family History: Reviewed, no changes.  Last detailed document date:11/20/2018.   Social History: Reviewed, no changes. Last detailed document date: 11/20/2018.    MEDICATIONS: (added, continued or stopped this visit) Started Medication Directions Instruction Stopped  amlodipine 5 mg tablet take 1 tablet by oral route  every day  07/30/2019  atenolol 25 mg tablet take 0.5 tablet by oral route  every day   07/30/2019 baclofen 10 mg tablet take one tablet po q 6 hours as needed for back spasms   05/07/2019 baclofen 10 mg tablet take one tablet po q 6 hours as needed for back spasms  07/30/2019 07/30/2019 diclofenac sodium 50 mg tablet,delayed release take 1 tablet by oral route 2 times every day   07/30/2019 gabapentin 300 mg capsule take 1 capsule by oral route 3 times every day   05/07/2019 gabapentin 300 mg capsule take 1 capsule by oral route 3 times every day  07/30/2019  loratadine 10 mg tablet take 1 tablet by oral route  every day    losartan 100 mg tablet take 1 tablet by oral route  every day  05/07/2019 Nucynta 50 mg tablet take 1 tablet by oral route  every 4 hours as needed for chronic pain Max 6/day ( FILL 05/13/2019) WORK COMP DNF UNTIL 05/13/2019  05/07/2019 Nucynta 50 mg tablet take 1 tablet by oral route  every 4 hours as needed for chronic pain ( MAX 6/day) FILL 06/13/2019 Workers Comp, DNF  06/13/2019  05/07/2019 Nucynta 50 mg tablet take 1 tablet by oral route  every 4 hours as needed for chronic pain  (DNF UNTIL 07/13/2019) DNF UNTIL 07/13/2019 WORK  COMP  07/30/2019 trazodone 50 mg tablet take 1 tablet by oral route 2 times every day at bedtime      ALLERGIES: Ingredient Reaction Medication Name Comment CODEINE     Reviewed, no changes.   REVIEW OF SYSTEMS  See scanned patient registration form, dated 01/09/2019, signed and dated on 07/30/2019  Review of Systems Details System Neg/Pos Details Constitutional Negative Chills, Fatigue, Fever, Malaise, Night sweats, Weight gain and Weight loss. ENMT Negative Ear drainage, Hearing loss, Nasal drainage, Otalgia, Sinus pressure and Sore throat. Eyes Negative Eye discharge, Eye pain and Vision changes. Respiratory Negative Chronic cough, Cough, Dyspnea, Known TB exposure and Wheezing. Cardio Negative Chest pain, Claudication, Edema and Irregular heartbeat/palpitations. GI Negative Abdominal pain, Blood in stool, Change in stool pattern, Constipation, Decreased appetite, Diarrhea, Heartburn, Nausea and Vomiting. GU Negative Dribbling, Dysuria, Erectile dysfunction, Hematuria, Polyuria (Genitourinary), Slow stream, Urinary frequency, Urinary incontinence and Urinary retention. Endocrine Negative Cold intolerance, Heat intolerance, Polydipsia and Polyphagia. Neuro Negative Dizziness, Extremity weakness, Gait disturbance, Headache, Memory impairment, Numbness in extremity, Seizures and Tremors. Psych Positive Anxiety, Depression. Psych Negative Insomnia. Integumentary Negative Brittle hair, Brittle nails, Change in shape/size of mole(s), Hair loss, Hirsutism, Hives, Pruritus, Rash and Skin lesion. MS Negative Back pain, Joint pain, Joint swelling, Muscle weakness and Neck pain. Hema/Lymph Negative Easy bleeding, Easy bruising and Lymphadenopathy. Allergic/Immuno Negative Contact allergy, Environmental allergies, Food allergies and Seasonal allergies. Reproductive Negative Penile discharge and Sexual dysfunction.  PHYSICAL  EXAM:  Vitals Date Temp F BP Pulse Ht In Wt Lb BMI BSA Pain Score 07/30/2019  133/91 76 70 254.8 36.56  6/10   PHYSICAL EXAM Details General Level of Distress: No acute distress Overall Appearance: Well groomed   Incision Incision:  Lumbar incision well-healed  Neurological Orientation: Awake alert orient x3 Recent and Remote Memory: Memory intact Attention Span and Concentration:   Normal  Musculoskeletal Gait and Station: Upright  Right Left Lower Extremity Muscle Strength: Normal Normal Lower Extremity Muscle Tone: Normal Normal   Motor Strength Lower extremity motor strength was tested in the clinically pertinent muscles.     Deep Tendon Reflexes  Right Left Patellar: normal normal Achilles: normal normal  Sensory Sensation was tested at L1 to S1. Any abnormal findings will be noted below.  Right Left L5: hyperpathic hyperpathic      DIAGNOSTIC RESULTS:  No new diagnostic studies have been reviewed at this time  The [place] prescription monitoring program was reviewed today.  Patient last received a prescription for his Nucynta on 07/13/2019 has 88/180 left in his bottle which is correct.  Urine drug screen was performed today as per our protocol.  Patient is negative for all substances which is consistent.  Typically Nucynta does not show up on POC testing.  Based on his pill count and historical compliance it is not necessary to send to the laboratory for confirmatory testing.    IMPRESSION:  Lumbar post laminectomy syndrome Chronic back pain Spinal cord stimulator  PLAN: 1. Continue with present medication regimen of Nucynta 50 mg 1 tablet Q 4-6 hours hours with a max of 6 per day for chronic pain.  We will continue with gabapentin 300 mg 3 times a day, but baclofen 10 mg q.6 hours.  We will add trazodone for a month to see if this is helpful in sleep.  We also at an anti-inflammatory for the next month.  We will plan to see  him in early October prior to his surgery.  This appointment we will discuss his postoperative pain management which I will anticipate he will need a dose adjustment in his pain medication.  Nucynta does work well for him.     Assessment/Plan  # Detail Type Description  1. Assessment Lumbar post-laminectomy syndrome (M96.1).     2. Assessment Degenerative lumbar disc (M51.36).     3. Assessment Low back pain, unspecified back pain laterality, with sciatica presence unspecified (M54.5).          MEDICATIONS PRESCRIBED TODAY    Rx Quantity Refills TRAZODONE HCL 50 mg  60 1 GABAPENTIN 300 mg  90 2 DICLOFENAC SODIUM 50 mg  60 0 BACLOFEN 10 mg  120 2           Provider:  Leonie Green NP  07/30/2019 01:49 PM    Dictation edited by: Blair Heys. Lindell Noe, NP    CC Providers: [place],  [place]  [id number]-   Eric Desantis MD  [address] [place], [place] [address]               Electronically signed by Leonie Green NP on 07/30/2019 01:58 PM Patient ID:   [id number] Patient: Eric Casey  Date of Birth: [birth year] Visit Type: Office Visit   Date: 02/06/2019 01:30 PM Provider: Marchia Meiers. Vertell Limber MD   This 44 year old male presents for back pain.  HISTORY OF PRESENT ILLNESS: 1.  back pain  The patient returns today to review his CT myelogram.  This shows no significant structural pathology in the thoracic spine, a small central disc protrusion at L3-4 which is not causing any nerve root compression or foraminal narrowing, and a recurrent L4-5 disc herniation versus spondylosis and scarring which is causing left-sided thecal sac and nerve root compression.  It is not possible based on the myelogram to determine if this is scar versus recurrent disc herniation although it appears to be a component of both.  The patient  remains extremely frustrated with his persistent level of pain and says that the majority of his pain is into his left leg and down to the dorsum of his left foot.  He grades his pain as 6/10 in severity.  He continues to have sciatica in his left leg.  I explained to the patient and his wife and later his case manager that I do not favor a redo diskectomy and that if we are going to do surgery I would favor a fusion at the L4-5 level with removal of the left sided facet joint which will allow [place] to remove all the spondylosis and nerve root compression as I do not believe I will be able to adequately do so with a redo diskectomy alone.  That would also not address his severe back pain and the advanced degeneration identified at this level on his post myelographic CT scan.  We talked about nonsurgical options but he is  frustrated with his failure to improve and says that this is not really a good option for him to proceed with.  The patient would like to proceed with surgery.  After discussing this in detail with the patient and his wife I met with the nurse case manager and reviewed my recommendations and projections for improvement.  I do not believe that this is going to substantially change his disability status and likelihood of returning to work.  She asked about projections for maximum medical improvement and I explained that the patient has been having persistent symptoms and has not reached maximum medical improvement for 10 years to date, so it is unlikely that this situation will be resolved promptly after surgery but I thought that a 6 month to 1 year time window would be appropriate as a projection      Medical/Surgical/Interim History Reviewed, no change.  Last detailed document date:11/20/2018.     PAST MEDICAL HISTORY, SURGICAL HISTORY, FAMILY HISTORY, SOCIAL HISTORY AND REVIEW OF SYSTEMS I have reviewed the patient's past medical, surgical, family and social history as well as the  comprehensive review of systems as included on the [facility] history form dated 01/09/2019, which I have signed.  Family History: Reviewed, no changes.  Last detailed document date:11/20/2018.   Social History: Reviewed, no changes. Last detailed document date: 11/20/2018.    MEDICATIONS: (added, continued or stopped this visit) Started Medication Directions Instruction Stopped 11/14/2018 baclofen 10 mg tablet take 1 tablet by oral route 2 times every day    loratadine 10 mg tablet take 1 tablet by oral route  every day    losartan 100 mg tablet take 1 tablet by oral route  every day   08/14/2018 Nucynta 50 mg tablet take 1 tablet by oral route  every 4 hours as needed (DNF 10/13/2018 Workers Comp ( DNF UNTIL 10/13/2018)  10/25/2018 Nucynta 50 mg tablet take 1 tablet by oral route  every 4 hours as needed (DNF 11/12/2018) WORK COMP  01/14/2019 Nucynta 50 mg tablet take 1 tablet by oral route  every 4 hours as needed for chronic pain WORK COMP  11/14/2018 Valium 2 mg tablet take one tablet BID as directed Change in dose. Weaning     ALLERGIES: Ingredient Reaction Medication Name Comment CODEINE     Reviewed, no changes.    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  PLAN: Proceed with redo decompression with TLIF fusion at the L4-5 level on the left.  Patient will be fitted for LSO brace and will have preoperative teaching.  Orders: Office Procedures/Services: Assessment Service Comments  LSO brace   Instruction(s)/Education: Assessment Instruction I10 Hypertension education [phone] Lifestyle education regarding diet  Completed Orders (this  encounter) Order Details Reason Side Interpretation Result Initial Treatment Date Region Hypertension education Patient to follow up with primary care provider.       Lifestyle education regarding diet Patient encouraged to eat a well balanced diet.        Assessment/Plan  # Detail Type Description  1. Assessment Spinal cord stimulator status (Z96.89).     2. Assessment Degenerative lumbar disc (M51.36).     3. Assessment Low back pain, unspecified back pain laterality, with sciatica presence unspecified (M54.5).     4. Assessment  Lumbar post-laminectomy syndrome (M96.1).     5. Assessment Disc displacement, lumbar (M51.26).     6. Assessment Essential (primary) hypertension (I10).     7. Assessment Body mass index (BMI) 36.0-36.9, adult (Z68.36).  Plan Orders Today's instructions / counseling include(s) Lifestyle education regarding diet. Clinical information/comments: Patient encouraged to eat a well balanced diet.     8. Other Orders Orders not associated to today's assessments.  Plan Orders LSO brace.    Pain Management Plan Pain Scale: 0/10. Method: Numeric Pain Intensity Scale. Location: back. Onset: 05/14/2009. Duration: varies. Quality: discomforting. Pain management follow-up plan of care: Patient taking medication as prescribed..              Provider:  Marchia Meiers. Vertell Limber MD  02/07/2019 10:23 AM    Dictation edited by: Marchia Meiers. Vertell Limber    CC Providers: [facility] [place],  [place]  [id number]-   Abbygale Lapid MD  [address] [place], [place] [address]               Electronically signed by Marchia Meiers. Vertell Limber MD on 02/07/2019 10:23 AM

## 2019-10-16 ENCOUNTER — Other Ambulatory Visit: Payer: Self-pay

## 2019-10-16 ENCOUNTER — Encounter (HOSPITAL_COMMUNITY): Payer: Self-pay

## 2019-10-16 ENCOUNTER — Encounter (HOSPITAL_COMMUNITY)
Admission: RE | Admit: 2019-10-16 | Discharge: 2019-10-16 | Disposition: A | Discharge status: Home / Self Care | Payer: PRIVATE HEALTH INSURANCE | Source: Ambulatory Visit | Attending: Neurosurgery | Admitting: Neurosurgery

## 2019-10-16 ENCOUNTER — Other Ambulatory Visit (HOSPITAL_COMMUNITY)
Admission: RE | Admit: 2019-10-16 | Discharge: 2019-10-16 | Disposition: A | Discharge status: Home / Self Care | Payer: HRSA Program | Source: Ambulatory Visit | Attending: Neurosurgery | Admitting: Neurosurgery

## 2019-10-16 DIAGNOSIS — Z20822 Contact with and (suspected) exposure to covid-19: Secondary | ICD-10-CM | POA: Diagnosis not present

## 2019-10-16 DIAGNOSIS — Z01812 Encounter for preprocedural laboratory examination: Secondary | ICD-10-CM | POA: Insufficient documentation

## 2019-10-16 DIAGNOSIS — Z01818 Encounter for other preprocedural examination: Secondary | ICD-10-CM | POA: Diagnosis present

## 2019-10-16 DIAGNOSIS — I10 Essential (primary) hypertension: Secondary | ICD-10-CM | POA: Insufficient documentation

## 2019-10-16 LAB — CBC
HCT: 44.8 % (ref 39.0–52.0)
Hemoglobin: 15.4 g/dL (ref 13.0–17.0)
MCH: 30.7 pg (ref 26.0–34.0)
MCHC: 34.4 g/dL (ref 30.0–36.0)
MCV: 89.4 fL (ref 80.0–100.0)
Platelets: 245 10*3/uL (ref 150–400)
RBC: 5.01 MIL/uL (ref 4.22–5.81)
RDW: 12.1 % (ref 11.5–15.5)
WBC: 4.4 10*3/uL (ref 4.0–10.5)
nRBC: 0 % (ref 0.0–0.2)

## 2019-10-16 LAB — TYPE AND SCREEN
ABO/RH(D): B POS
Antibody Screen: NEGATIVE

## 2019-10-16 LAB — BASIC METABOLIC PANEL
Anion gap: 9 (ref 5–15)
BUN: 14 mg/dL (ref 6–20)
CO2: 25 mmol/L (ref 22–32)
Calcium: 9.7 mg/dL (ref 8.9–10.3)
Chloride: 104 mmol/L (ref 98–111)
Creatinine, Ser: 1.23 mg/dL (ref 0.61–1.24)
GFR, Estimated: >60 mL/min (ref >60)
Glucose, Bld: 145 mg/dL — ABNORMAL HIGH (ref 70–99)
Potassium: 4 mmol/L (ref 3.5–5.1)
Sodium: 138 mmol/L (ref 135–145)

## 2019-10-16 LAB — SURGICAL PCR SCREEN
MRSA, PCR: NEGATIVE
Staphylococcus aureus: NEGATIVE

## 2019-10-16 LAB — SARS CORONAVIRUS 2 (TAT 6-24 HRS): SARS Coronavirus 2: NEGATIVE

## 2019-10-16 NOTE — Progress Notes (Signed)
[facility] - [place], [place] - [address] [place] [address] Phone: [phone] Fax: [phone]  [facility] - [place], [place] - [address] [place] [address] Phone: [phone] Fax: [phone]  [facility] - [place], [place] - [address] [place] [address] Phone: [phone] Fax: [phone]  [facility] [address], [place] - [address] [facility] [place] [address] Phone: [phone] Fax: [phone]      Your procedure is scheduled on October 15  Report to [facility] Main Entrance "A" at 0530 A.M., and check in at the Admitting office.  Call this number if you have problems the morning of surgery:  [phone]  Call [phone] if you have any questions prior to your surgery date Monday-Friday 8am-4pm    Remember:  Do not eat  Or drink after midnight the night before your surgery    Take these medicines the morning of surgery with A SIP OF WATER  atenolol (TENORMIN) baclofen (LIORESAL)  fluticasone (FLONASE) if needed gabapentin (NEURONTIN) tapentadol (NUCYNTA) if needed for pain  As of today, STOP taking any Aspirin (unless otherwise instructed by your surgeon) Aleve, Naproxen, Ibuprofen, Motrin, Advil, Goody's, BC's, all herbal medications, fish oil, and all vitamins.                      Do not wear jewelry            Do not wear lotions, powders, colognes, or deodorant.            Men may shave face and neck.            Do not bring valuables to the hospital.            [facility] is not responsible for any belongings or valuables.  Do NOT Smoke (Tobacco/Vaping) or drink Alcohol 24 hours prior to your procedure If you use a CPAP at night, you may bring all equipment for your overnight stay.   Contacts, glasses, dentures or bridgework may not be worn into surgery.      For patients admitted to the hospital,  discharge time will be determined by your treatment team.   Patients discharged the day of surgery will not be allowed to drive home, and someone needs to stay with them for 24 hours.    Special instructions:   [place]- Preparing For Surgery  Before surgery, you can play an important role. Because skin is not sterile, your skin needs to be as free of germs as possible. You can reduce the number of germs on your skin by washing with CHG (chlorahexidine gluconate) Soap before surgery.  CHG is an antiseptic cleaner which kills germs and bonds with the skin to continue killing germs even after washing.    Oral Hygiene is also important to reduce your risk of infection.  Remember - BRUSH YOUR TEETH THE MORNING OF SURGERY WITH YOUR REGULAR TOOTHPASTE  Please do not use if you have an allergy to CHG or antibacterial soaps. If your skin becomes reddened/irritated stop using the CHG.  Do not shave (including legs and underarms) for at least 48 hours prior to first CHG shower. It is OK to shave your face.  Please follow these instructions carefully.   1. Shower the NIGHT BEFORE SURGERY and the MORNING OF SURGERY with CHG Soap.  2. If you chose to wash your hair, wash your hair first as usual with your normal shampoo.  3. After you shampoo, rinse your hair and body thoroughly to remove the shampoo.  4. Use CHG as you would any other liquid soap. You can apply CHG directly to the skin and wash gently with a scrungie or a clean washcloth.   5. Apply the CHG Soap to your body ONLY FROM THE NECK DOWN.  Do not use on open wounds or open sores. Avoid contact with your eyes, ears, mouth and genitals (private parts). Wash Face and genitals (private parts)  with your normal soap.   6. Wash thoroughly, paying special attention to the area where your surgery will be performed.  7. Thoroughly rinse your body with warm water from the neck down.  8. DO NOT shower/wash with your normal soap after using  and rinsing off the CHG Soap.  9. Pat yourself dry with a CLEAN TOWEL.  10. Wear CLEAN PAJAMAS to bed the night before surgery  11. Place CLEAN SHEETS on your bed the night of your first shower and DO NOT SLEEP WITH PETS.   Day of Surgery: Wear Clean/Comfortable clothing the morning of surgery Do not apply any deodorants/lotions.   Remember to brush your teeth WITH YOUR REGULAR TOOTHPASTE.   Please read over the following fact sheets that you were given.

## 2019-10-16 NOTE — Progress Notes (Signed)
PCP - Doreene Eland Cardiologist - n/a  Chest x-ray - n/a EKG - 10/16/19 Stress Test - denies  ECHO - denies Cardiac Cath - denies  COVID TEST- 10/16/19   Anesthesia review: No  Patient denies shortness of breath, fever, cough and chest pain at PAT appointment   All instructions explained to the patient, with a verbal understanding of the material. Patient agrees to go over the instructions while at home for a better understanding. Patient also instructed to self quarantine after being tested for COVID-19. The opportunity to ask questions was provided.

## 2019-10-18 ENCOUNTER — Encounter (HOSPITAL_COMMUNITY)
Admission: RE | Disposition: A | Discharge status: Home / Self Care | Payer: Self-pay | Source: Home / Self Care | Attending: Neurosurgery

## 2019-10-18 ENCOUNTER — Inpatient Hospital Stay (HOSPITAL_COMMUNITY)
Admission: RE | Admit: 2019-10-18 | Discharge: 2019-10-19 | DRG: 455 | Disposition: A | Discharge status: Home / Self Care | Payer: PRIVATE HEALTH INSURANCE | Attending: Neurosurgery | Admitting: Neurosurgery

## 2019-10-18 ENCOUNTER — Inpatient Hospital Stay (HOSPITAL_COMMUNITY): Payer: PRIVATE HEALTH INSURANCE | Admitting: Certified Registered Nurse Anesthetist

## 2019-10-18 ENCOUNTER — Other Ambulatory Visit: Payer: Self-pay

## 2019-10-18 ENCOUNTER — Inpatient Hospital Stay (HOSPITAL_COMMUNITY): Payer: PRIVATE HEALTH INSURANCE

## 2019-10-18 ENCOUNTER — Encounter (HOSPITAL_COMMUNITY): Payer: Self-pay | Admitting: Neurosurgery

## 2019-10-18 DIAGNOSIS — I10 Essential (primary) hypertension: Secondary | ICD-10-CM | POA: Diagnosis present

## 2019-10-18 DIAGNOSIS — Z885 Allergy status to narcotic agent status: Secondary | ICD-10-CM | POA: Diagnosis not present

## 2019-10-18 DIAGNOSIS — Z9689 Presence of other specified functional implants: Secondary | ICD-10-CM | POA: Diagnosis present

## 2019-10-18 DIAGNOSIS — M5020 Other cervical disc displacement, unspecified cervical region: Secondary | ICD-10-CM | POA: Diagnosis present

## 2019-10-18 DIAGNOSIS — Y838 Other surgical procedures as the cause of abnormal reaction of the patient, or of later complication, without mention of misadventure at the time of the procedure: Secondary | ICD-10-CM | POA: Diagnosis present

## 2019-10-18 DIAGNOSIS — M961 Postlaminectomy syndrome, not elsewhere classified: Secondary | ICD-10-CM | POA: Diagnosis present

## 2019-10-18 DIAGNOSIS — M48061 Spinal stenosis, lumbar region without neurogenic claudication: Secondary | ICD-10-CM | POA: Diagnosis present

## 2019-10-18 DIAGNOSIS — G8929 Other chronic pain: Secondary | ICD-10-CM | POA: Diagnosis present

## 2019-10-18 DIAGNOSIS — Z79891 Long term (current) use of opiate analgesic: Secondary | ICD-10-CM

## 2019-10-18 DIAGNOSIS — M5116 Intervertebral disc disorders with radiculopathy, lumbar region: Secondary | ICD-10-CM | POA: Diagnosis present

## 2019-10-18 DIAGNOSIS — Z419 Encounter for procedure for purposes other than remedying health state, unspecified: Secondary | ICD-10-CM

## 2019-10-18 DIAGNOSIS — Z79899 Other long term (current) drug therapy: Secondary | ICD-10-CM | POA: Diagnosis not present

## 2019-10-18 HISTORY — PX: TRANSFORAMINAL LUMBAR INTERBODY FUSION (TLIF) WITH PEDICLE SCREW FIXATION 1 LEVEL: SHX6141

## 2019-10-18 SURGERY — TRANSFORAMINAL LUMBAR INTERBODY FUSION (TLIF) WITH PEDICLE SCREW FIXATION 1 LEVEL
Anesthesia: General | Site: Spine Lumbar | Laterality: Left

## 2019-10-18 MED ORDER — LIDOCAINE-EPINEPHRINE 1 %-1:100000 IJ SOLN
INTRAMUSCULAR | Status: AC
  Filled 2019-10-18: qty 1

## 2019-10-18 MED ORDER — ONDANSETRON HCL 4 MG/2ML IJ SOLN
INTRAMUSCULAR | Status: DC | PRN
  Administered 2019-10-18: 4 mg via INTRAVENOUS

## 2019-10-18 MED ORDER — FENTANYL CITRATE (PF) 100 MCG/2ML IJ SOLN
25.0000 ug | INTRAMUSCULAR | Status: DC | PRN
  Administered 2019-10-18 (×2): 50 ug via INTRAVENOUS
  Administered 2019-10-18: 25 ug via INTRAVENOUS

## 2019-10-18 MED ORDER — ORAL CARE MOUTH RINSE: 15.0000 mL | Freq: Once | OROMUCOSAL | Status: AC

## 2019-10-18 MED ORDER — MIDAZOLAM HCL 2 MG/2ML IJ SOLN
INTRAMUSCULAR | Status: AC
  Filled 2019-10-18: qty 2

## 2019-10-18 MED ORDER — ROCURONIUM BROMIDE 10 MG/ML (PF) SYRINGE
PREFILLED_SYRINGE | INTRAVENOUS | Status: DC | PRN
  Administered 2019-10-18: 20 mg via INTRAVENOUS
  Administered 2019-10-18: 40 mg via INTRAVENOUS
  Administered 2019-10-18: 60 mg via INTRAVENOUS
  Administered 2019-10-18: 30 mg via INTRAVENOUS

## 2019-10-18 MED ORDER — FENTANYL CITRATE (PF) 250 MCG/5ML IJ SOLN
INTRAMUSCULAR | Status: DC | PRN
Start: 2019-10-18 — End: 2019-10-18
  Administered 2019-10-18: 100 ug via INTRAVENOUS
  Administered 2019-10-18 (×2): 25 ug via INTRAVENOUS

## 2019-10-18 MED ORDER — ZOLPIDEM TARTRATE 5 MG PO TABS: 5.0000 mg | ORAL_TABLET | Freq: Every evening | ORAL | Status: DC | PRN

## 2019-10-18 MED ORDER — CHLORHEXIDINE GLUCONATE 0.12 % MT SOLN
15.0000 mL | Freq: Once | OROMUCOSAL | Status: AC
  Administered 2019-10-18: 15 mL via OROMUCOSAL
  Filled 2019-10-18: qty 15

## 2019-10-18 MED ORDER — DOCUSATE SODIUM 100 MG PO CAPS
100.0000 mg | ORAL_CAPSULE | Freq: Two times a day (BID) | ORAL | Status: DC
  Administered 2019-10-18 (×2): 100 mg via ORAL
  Filled 2019-10-18 (×2): qty 1

## 2019-10-18 MED ORDER — ALUM & MAG HYDROXIDE-SIMETH 200-200-20 MG/5ML PO SUSP: 30.0000 mL | Freq: Four times a day (QID) | ORAL | Status: DC | PRN

## 2019-10-18 MED ORDER — POLYETHYLENE GLYCOL 3350 17 G PO PACK: 17.0000 g | PACK | Freq: Every day | ORAL | Status: DC | PRN

## 2019-10-18 MED ORDER — SODIUM CHLORIDE 0.9 % IV SOLN
250.0000 mL | INTRAVENOUS | Status: DC
  Administered 2019-10-18: 250 mL via INTRAVENOUS

## 2019-10-18 MED ORDER — CEFAZOLIN SODIUM-DEXTROSE 2-4 GM/100ML-% IV SOLN
2.0000 g | INTRAVENOUS | Status: AC
  Administered 2019-10-18: 2 g via INTRAVENOUS

## 2019-10-18 MED ORDER — BACLOFEN 10 MG PO TABS
10.0000 mg | ORAL_TABLET | Freq: Three times a day (TID) | ORAL | Status: DC
  Administered 2019-10-18: 10 mg via ORAL
  Filled 2019-10-18 (×2): qty 1

## 2019-10-18 MED ORDER — CEFAZOLIN SODIUM-DEXTROSE 2-4 GM/100ML-% IV SOLN
2.0000 g | Freq: Three times a day (TID) | INTRAVENOUS | Status: AC
  Administered 2019-10-18 – 2019-10-19 (×2): 2 g via INTRAVENOUS
  Filled 2019-10-18 (×3): qty 100

## 2019-10-18 MED ORDER — DIPHENHYDRAMINE HCL 50 MG/ML IJ SOLN
INTRAMUSCULAR | Status: DC | PRN
  Administered 2019-10-18: 25 mg via INTRAVENOUS

## 2019-10-18 MED ORDER — FENTANYL CITRATE (PF) 250 MCG/5ML IJ SOLN
INTRAMUSCULAR | Status: AC
  Filled 2019-10-18: qty 5

## 2019-10-18 MED ORDER — LIDOCAINE-EPINEPHRINE 1 %-1:100000 IJ SOLN
INTRAMUSCULAR | Status: DC | PRN
  Administered 2019-10-18: 5 mL

## 2019-10-18 MED ORDER — CHLORHEXIDINE GLUCONATE CLOTH 2 % EX PADS: 6.0000 | MEDICATED_PAD | Freq: Once | CUTANEOUS | Status: DC

## 2019-10-18 MED ORDER — BUPIVACAINE LIPOSOME 1.3 % IJ SUSP
20.0000 mL | Freq: Once | INTRAMUSCULAR | Status: AC
  Administered 2019-10-18: 20 mL
  Filled 2019-10-18: qty 20

## 2019-10-18 MED ORDER — DIAZEPAM 5 MG PO TABS
ORAL_TABLET | ORAL | Status: AC
  Filled 2019-10-18: qty 1

## 2019-10-18 MED ORDER — KCL IN DEXTROSE-NACL 20-5-0.45 MEQ/L-%-% IV SOLN
INTRAVENOUS | Status: DC
  Filled 2019-10-18: qty 1000

## 2019-10-18 MED ORDER — BUPIVACAINE HCL (PF) 0.5 % IJ SOLN
INTRAMUSCULAR | Status: DC | PRN
  Administered 2019-10-18: 5 mL

## 2019-10-18 MED ORDER — ONDANSETRON HCL 4 MG/2ML IJ SOLN
4.0000 mg | Freq: Four times a day (QID) | INTRAMUSCULAR | Status: DC | PRN
  Filled 2019-10-18: qty 2

## 2019-10-18 MED ORDER — MIDAZOLAM HCL 5 MG/5ML IJ SOLN
INTRAMUSCULAR | Status: DC | PRN
  Administered 2019-10-18: 2 mg via INTRAVENOUS

## 2019-10-18 MED ORDER — ONDANSETRON HCL 4 MG/2ML IJ SOLN
INTRAMUSCULAR | Status: AC
  Filled 2019-10-18: qty 2

## 2019-10-18 MED ORDER — ACETAMINOPHEN 325 MG PO TABS
650.0000 mg | ORAL_TABLET | ORAL | Status: DC | PRN
  Filled 2019-10-18: qty 2

## 2019-10-18 MED ORDER — ATENOLOL 25 MG PO TABS
25.0000 mg | ORAL_TABLET | Freq: Every day | ORAL | Status: DC
  Filled 2019-10-18: qty 1

## 2019-10-18 MED ORDER — BISACODYL 10 MG RE SUPP: 10.0000 mg | Freq: Every day | RECTAL | Status: DC | PRN

## 2019-10-18 MED ORDER — LIDOCAINE 2% (20 MG/ML) 5 ML SYRINGE
INTRAMUSCULAR | Status: DC | PRN
  Administered 2019-10-18: 40 mg via INTRAVENOUS
  Administered 2019-10-18: 60 mg via INTRAVENOUS

## 2019-10-18 MED ORDER — HYDROMORPHONE HCL 1 MG/ML IJ SOLN
0.5000 mg | INTRAMUSCULAR | Status: DC | PRN
  Administered 2019-10-18: 0.5 mg via INTRAVENOUS
  Filled 2019-10-18: qty 0.5

## 2019-10-18 MED ORDER — LOSARTAN POTASSIUM 50 MG PO TABS
100.0000 mg | ORAL_TABLET | Freq: Every day | ORAL | Status: DC
  Administered 2019-10-18: 100 mg via ORAL
  Filled 2019-10-18: qty 2

## 2019-10-18 MED ORDER — DEXAMETHASONE SODIUM PHOSPHATE 10 MG/ML IJ SOLN
INTRAMUSCULAR | Status: DC | PRN
  Administered 2019-10-18: 10 mg via INTRAVENOUS

## 2019-10-18 MED ORDER — 0.9 % SODIUM CHLORIDE (POUR BTL) OPTIME
TOPICAL | Status: DC | PRN
  Administered 2019-10-18: 1000 mL

## 2019-10-18 MED ORDER — ACETAMINOPHEN 500 MG PO TABS
ORAL_TABLET | ORAL | Status: AC
  Filled 2019-10-18: qty 2

## 2019-10-18 MED ORDER — PANTOPRAZOLE SODIUM 40 MG PO TBEC
40.0000 mg | DELAYED_RELEASE_TABLET | Freq: Every day | ORAL | Status: DC
  Administered 2019-10-18: 40 mg via ORAL
  Filled 2019-10-18: qty 1

## 2019-10-18 MED ORDER — LIDOCAINE 2% (20 MG/ML) 5 ML SYRINGE
INTRAMUSCULAR | Status: AC
  Filled 2019-10-18: qty 5

## 2019-10-18 MED ORDER — GABAPENTIN 300 MG PO CAPS
300.0000 mg | ORAL_CAPSULE | Freq: Three times a day (TID) | ORAL | Status: DC
  Administered 2019-10-18 (×2): 300 mg via ORAL
  Filled 2019-10-18 (×3): qty 1

## 2019-10-18 MED ORDER — ACETAMINOPHEN 325 MG PO TABS
ORAL_TABLET | ORAL | Status: DC | PRN
  Administered 2019-10-18: 1000 mg via ORAL

## 2019-10-18 MED ORDER — CEFAZOLIN SODIUM-DEXTROSE 2-4 GM/100ML-% IV SOLN
INTRAVENOUS | Status: AC
  Filled 2019-10-18: qty 100

## 2019-10-18 MED ORDER — THROMBIN 5000 UNITS EX SOLR
CUTANEOUS | Status: AC
  Filled 2019-10-18: qty 5000

## 2019-10-18 MED ORDER — SUGAMMADEX SODIUM 200 MG/2ML IV SOLN
INTRAVENOUS | Status: DC | PRN
  Administered 2019-10-18: 200 mg via INTRAVENOUS

## 2019-10-18 MED ORDER — PANTOPRAZOLE SODIUM 40 MG IV SOLR: 40.0000 mg | Freq: Every day | INTRAVENOUS | Status: DC

## 2019-10-18 MED ORDER — PHENYLEPHRINE 40 MCG/ML (10ML) SYRINGE FOR IV PUSH (FOR BLOOD PRESSURE SUPPORT)
PREFILLED_SYRINGE | INTRAVENOUS | Status: DC | PRN
  Administered 2019-10-18 (×4): 80 ug via INTRAVENOUS

## 2019-10-18 MED ORDER — SODIUM CHLORIDE 0.9% FLUSH
3.0000 mL | Freq: Two times a day (BID) | INTRAVENOUS | Status: DC
  Administered 2019-10-18: 3 mL via INTRAVENOUS

## 2019-10-18 MED ORDER — DEXAMETHASONE SODIUM PHOSPHATE 10 MG/ML IJ SOLN
INTRAMUSCULAR | Status: AC
  Filled 2019-10-18: qty 1

## 2019-10-18 MED ORDER — DIAZEPAM 5 MG PO TABS
5.0000 mg | ORAL_TABLET | Freq: Four times a day (QID) | ORAL | Status: DC | PRN
  Administered 2019-10-18 – 2019-10-19 (×4): 5 mg via ORAL
  Filled 2019-10-18 (×3): qty 1

## 2019-10-18 MED ORDER — FLEET ENEMA 7-19 GM/118ML RE ENEM: 1.0000 | ENEMA | Freq: Once | RECTAL | Status: DC | PRN

## 2019-10-18 MED ORDER — ONDANSETRON HCL 4 MG/2ML IJ SOLN: 4.0000 mg | Freq: Once | INTRAMUSCULAR | Status: DC | PRN

## 2019-10-18 MED ORDER — FENTANYL CITRATE (PF) 100 MCG/2ML IJ SOLN
INTRAMUSCULAR | Status: AC
  Filled 2019-10-18: qty 2

## 2019-10-18 MED ORDER — EPHEDRINE SULFATE-NACL 50-0.9 MG/10ML-% IV SOSY
PREFILLED_SYRINGE | INTRAVENOUS | Status: DC | PRN
  Administered 2019-10-18 (×4): 10 mg via INTRAVENOUS

## 2019-10-18 MED ORDER — PROPOFOL 10 MG/ML IV BOLUS
INTRAVENOUS | Status: DC | PRN
  Administered 2019-10-18: 180 mg via INTRAVENOUS

## 2019-10-18 MED ORDER — TAPENTADOL HCL 50 MG PO TABS
50.0000 mg | ORAL_TABLET | ORAL | Status: DC | PRN
  Administered 2019-10-18 – 2019-10-19 (×5): 100 mg via ORAL
  Filled 2019-10-18 (×7): qty 2

## 2019-10-18 MED ORDER — SODIUM CHLORIDE 0.9% FLUSH: 3.0000 mL | INTRAVENOUS | Status: DC | PRN

## 2019-10-18 MED ORDER — ONDANSETRON HCL 4 MG PO TABS
4.0000 mg | ORAL_TABLET | Freq: Four times a day (QID) | ORAL | Status: DC | PRN
  Filled 2019-10-18: qty 1

## 2019-10-18 MED ORDER — ROCURONIUM BROMIDE 10 MG/ML (PF) SYRINGE
PREFILLED_SYRINGE | INTRAVENOUS | Status: AC
  Filled 2019-10-18: qty 10

## 2019-10-18 MED ORDER — LACTATED RINGERS IV SOLN: INTRAVENOUS | Status: DC

## 2019-10-18 MED ORDER — PHENOL 1.4 % MT LIQD
1.0000 | OROMUCOSAL | Status: DC | PRN
  Filled 2019-10-18: qty 177

## 2019-10-18 MED ORDER — PROPOFOL 10 MG/ML IV BOLUS
INTRAVENOUS | Status: AC
  Filled 2019-10-18: qty 20

## 2019-10-18 MED ORDER — GLYCOPYRROLATE PF 0.2 MG/ML IJ SOSY
PREFILLED_SYRINGE | INTRAMUSCULAR | Status: DC | PRN
  Administered 2019-10-18: .2 mg via INTRAVENOUS

## 2019-10-18 MED ORDER — BUPIVACAINE HCL (PF) 0.5 % IJ SOLN
INTRAMUSCULAR | Status: AC
  Filled 2019-10-18: qty 30

## 2019-10-18 MED ORDER — THROMBIN 5000 UNITS EX SOLR
OROMUCOSAL | Status: DC | PRN
  Administered 2019-10-18: 5 mL via TOPICAL

## 2019-10-18 MED ORDER — ACETAMINOPHEN 650 MG RE SUPP
650.0000 mg | RECTAL | Status: DC | PRN
  Filled 2019-10-18: qty 1

## 2019-10-18 MED ORDER — FLUTICASONE PROPIONATE 50 MCG/ACT NA SUSP
2.0000 | Freq: Every day | NASAL | Status: DC | PRN
  Filled 2019-10-18: qty 16

## 2019-10-18 MED ORDER — MENTHOL 3 MG MT LOZG
1.0000 | LOZENGE | OROMUCOSAL | Status: DC | PRN
  Filled 2019-10-18 (×2): qty 9

## 2019-10-18 SURGICAL SUPPLY — 84 items
ADH SKN CLS APL DERMABOND .7 (GAUZE/BANDAGES/DRESSINGS) ×1
BASKET BONE COLLECTION (BASKET) ×3 IMPLANT
BLADE CLIPPER SURG (BLADE) ×2 IMPLANT
BONE CANC CHIPS 20CC PCAN1/4 (Bone Implant) ×3 IMPLANT
BUR MATCHSTICK NEURO 3.0 LAGG (BURR) ×3 IMPLANT
BUR PRECISION FLUTE 5.0 (BURR) ×3 IMPLANT
CANISTER SUCT 3000ML PPV (MISCELLANEOUS) ×3 IMPLANT
CARTRIDGE OIL MAESTRO DRILL (MISCELLANEOUS) ×1 IMPLANT
CHIPS CANC BONE 20CC PCAN1/4 (Bone Implant) ×1 IMPLANT
CNTNR URN SCR LID CUP LEK RST (MISCELLANEOUS) ×1 IMPLANT
CONT SPEC 4OZ STRL OR WHT (MISCELLANEOUS) ×3
COVER BACK TABLE 24X17X13 BIG (DRAPES) IMPLANT
COVER BACK TABLE 60X90IN (DRAPES) ×3 IMPLANT
COVER WAND RF STERILE (DRAPES) ×1 IMPLANT
DECANTER SPIKE VIAL GLASS SM (MISCELLANEOUS) ×3 IMPLANT
DERMABOND ADVANCED (GAUZE/BANDAGES/DRESSINGS) ×2
DERMABOND ADVANCED .7 DNX12 (GAUZE/BANDAGES/DRESSINGS) ×1 IMPLANT
DIFFUSER DRILL AIR PNEUMATIC (MISCELLANEOUS) ×3 IMPLANT
DRAPE C-ARM 42X72 X-RAY (DRAPES) ×3 IMPLANT
DRAPE C-ARMOR (DRAPES) ×3 IMPLANT
DRAPE LAPAROTOMY 100X72X124 (DRAPES) ×3 IMPLANT
DRAPE SURG 17X23 STRL (DRAPES) ×3 IMPLANT
DRSG OPSITE POSTOP 4X6 (GAUZE/BANDAGES/DRESSINGS) ×2 IMPLANT
DURAPREP 26ML APPLICATOR (WOUND CARE) ×3 IMPLANT
ELECT BLADE 4.0 EZ CLEAN MEGAD (MISCELLANEOUS) ×3
ELECT REM PT RETURN 9FT ADLT (ELECTROSURGICAL) ×3
ELECTRODE BLDE 4.0 EZ CLN MEGD (MISCELLANEOUS) IMPLANT
ELECTRODE REM PT RTRN 9FT ADLT (ELECTROSURGICAL) ×1 IMPLANT
GAUZE 4X4 16PLY RFD (DISPOSABLE) IMPLANT
GAUZE SPONGE 4X4 12PLY STRL (GAUZE/BANDAGES/DRESSINGS) ×1 IMPLANT
GLOVE BIO SURGEON STRL SZ7.5 (GLOVE) ×4 IMPLANT
GLOVE BIO SURGEON STRL SZ8 (GLOVE) ×10 IMPLANT
GLOVE BIOGEL PI IND STRL 8 (GLOVE) ×2 IMPLANT
GLOVE BIOGEL PI IND STRL 8.5 (GLOVE) ×2 IMPLANT
GLOVE BIOGEL PI INDICATOR 8 (GLOVE) ×14
GLOVE BIOGEL PI INDICATOR 8.5 (GLOVE) ×8
GLOVE ECLIPSE 7.5 STRL STRAW (GLOVE) ×8 IMPLANT
GLOVE ECLIPSE 8.0 STRL XLNG CF (GLOVE) ×6 IMPLANT
GLOVE EXAM NITRILE XL STR (GLOVE) IMPLANT
GOWN STRL REUS W/ TWL LRG LVL3 (GOWN DISPOSABLE) IMPLANT
GOWN STRL REUS W/ TWL XL LVL3 (GOWN DISPOSABLE) ×2 IMPLANT
GOWN STRL REUS W/TWL 2XL LVL3 (GOWN DISPOSABLE) ×12 IMPLANT
GOWN STRL REUS W/TWL LRG LVL3 (GOWN DISPOSABLE)
GOWN STRL REUS W/TWL XL LVL3 (GOWN DISPOSABLE) ×12
GRAFT BNE CANC CHIPS 1-8 20CC (Bone Implant) IMPLANT
HEMOSTAT POWDER SURGIFOAM 1G (HEMOSTASIS) ×2 IMPLANT
IMPL TLX20 10X11X31 20D (Cage) IMPLANT
KIT BASIN OR (CUSTOM PROCEDURE TRAY) ×3 IMPLANT
KIT INFUSE XX SMALL 0.7CC (Orthopedic Implant) ×2 IMPLANT
KIT POSITION SURG JACKSON T1 (MISCELLANEOUS) ×3 IMPLANT
KIT TURNOVER KIT B (KITS) ×3 IMPLANT
MILL MEDIUM DISP (BLADE) ×2 IMPLANT
NDL HYPO 21X1.5 SAFETY (NEEDLE) IMPLANT
NDL HYPO 25X1 1.5 SAFETY (NEEDLE) ×1 IMPLANT
NDL SPNL 18GX3.5 QUINCKE PK (NEEDLE) IMPLANT
NEEDLE HYPO 21X1.5 SAFETY (NEEDLE) ×3 IMPLANT
NEEDLE HYPO 25X1 1.5 SAFETY (NEEDLE) ×3 IMPLANT
NEEDLE SPNL 18GX3.5 QUINCKE PK (NEEDLE) IMPLANT
NS IRRIG 1000ML POUR BTL (IV SOLUTION) ×3 IMPLANT
OIL CARTRIDGE MAESTRO DRILL (MISCELLANEOUS) ×3
PACK LAMINECTOMY NEURO (CUSTOM PROCEDURE TRAY) ×3 IMPLANT
PAD ARMBOARD 7.5X6 YLW CONV (MISCELLANEOUS) ×9 IMPLANT
PATTIES SURGICAL .5 X.5 (GAUZE/BANDAGES/DRESSINGS) IMPLANT
PATTIES SURGICAL .5 X1 (DISPOSABLE) IMPLANT
PATTIES SURGICAL 1X1 (DISPOSABLE) IMPLANT
ROD RELINE LORDOTIC 5.5X45 (Rod) ×2 IMPLANT
ROD RELINE-O LORD 5.5X40 (Rod) ×2 IMPLANT
SCREW LOCK RELINE 5.5 TULIP (Screw) ×8 IMPLANT
SCREW RELINE-O POLY 6.5X45 (Screw) ×8 IMPLANT
SPONGE LAP 4X18 RFD (DISPOSABLE) IMPLANT
SPONGE SURGIFOAM ABS GEL 100 (HEMOSTASIS) ×1 IMPLANT
STAPLER SKIN PROX WIDE 3.9 (STAPLE) IMPLANT
SUT VIC AB 1 CT1 18XBRD ANBCTR (SUTURE) ×2 IMPLANT
SUT VIC AB 1 CT1 8-18 (SUTURE) ×6
SUT VIC AB 2-0 CT1 18 (SUTURE) ×4 IMPLANT
SUT VIC AB 3-0 SH 8-18 (SUTURE) ×4 IMPLANT
SYR 20ML LL LF (SYRINGE) ×2 IMPLANT
SYR 3ML LL SCALE MARK (SYRINGE) ×2 IMPLANT
SYR 5ML LL (SYRINGE) IMPLANT
TLX20 IMPLANT 10X11X31 20D (Cage) ×3 IMPLANT
TOWEL GREEN STERILE (TOWEL DISPOSABLE) ×3 IMPLANT
TOWEL GREEN STERILE FF (TOWEL DISPOSABLE) ×3 IMPLANT
TRAY FOLEY MTR SLVR 16FR STAT (SET/KITS/TRAYS/PACK) ×3 IMPLANT
WATER STERILE IRR 1000ML POUR (IV SOLUTION) ×3 IMPLANT

## 2019-10-18 NOTE — Op Note (Signed)
10/18/2019  10:23 AM  PATIENT:  Eric Casey  44 y.o. 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   POST-OPERATIVE DIAGNOSIS:  Recurrent Disc displacement, Lumbar, spondylosis, stenosis, DDD, lumbago, radiculopathy L 45 level   PROCEDURE:  Procedure(s) with comments: Left Lumbar Four-Five Transforaminal lumbar interbody fusion (Left) - posterior with expandable titanium cage, autograft, pedicle screw fixation, posterolateral arthrodesis  SURGEON:  Surgeon(s) and Role:    Erline Levine, MD - Primary  PHYSICIAN ASSISTANT: Glenford Peers, NP  ASSISTANTS: Poteat, RN   ANESTHESIA:   general  EBL:  150 mL   BLOOD ADMINISTERED:none  DRAINS: none   LOCAL MEDICATIONS USED:  MARCAINE    and LIDOCAINE   SPECIMEN:  No Specimen  DISPOSITION OF SPECIMEN:  N/A  COUNTS:  YES  TOURNIQUET:  * No tourniquets in log *  DICTATION: Patient is 44 year old man with 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 He has a left L5 radiculopathy. It was elected to take him to surgery for left TLIF decompression and fusion at this level.   Procedure: Patient was placed in a prone position on the [place] table after smooth and uncomplicated induction of general endotracheal anesthesia. His low back was prepped and draped in usual sterile fashion with betadine scrub and DuraPrep after localizing anatomy with C arm. Area of incision was infiltrated with local lidocaine. Incision was made to the lumbodorsal fascia was incised and exposure was performed of the L4 through L5 spinous processes laminae facet joint and transverse processes. Intraoperative x-ray was obtained which confirmed correct orientation. A total lleft hemi-laminectomy of L4 was performed with disarticulation of the facet joints at this level and thorough decompression was performed of both L4 and L5 nerve  roots along with the common dural tube. This decompression was more involved than would be typical of that performed for PLIF alone and included painstaking dissection of adherent ligament compressing the thecal sac and wide decompression of all neural elements. There was significant scarring around the thecal sac and left L 5 nerve root. A thorough discectomy was performed on the left with preparation of the endplates for grafting a trial spacer was placed this level. Bone autograft was packed within the interspace on the left along with extra extra small BMP kit. A 10 x 11 x 31 mm expandable titanium TLIF cage was inserted the interspace and countersunk appropriately and opened to 15 degrees lordosis. The posterolateral region was extensively decorticated and pedicle probes were placed at L4 and L5 bilaterally. Intraoperative fluoroscopy confirmed correct orientationin the AP and lateral plane. 45 x 6.5 mm pedicle screws were placed at L5 bilaterally and 45 x 6.5 mm screws placed at L4 bilaterally final x-rays demonstrated well-positioned interbody grafts and pedicle screw fixation. A 45 mm lordotic rod was placed on the right and a 45 mm rod was placed on the left locked down in situ and the posterolateral region was packed with the remaining BMP and bone autograft and 20 cc allograft chips on the right. The wound was irrigated and long-acting Marcaine was injected in the deep musculature. Fascia was closed with 1 Vicryl sutures skin edges were reapproximated 2 and 3-0 Vicryl sutures. The wound is dressed with Dermabond and an occlusive dressing and was extubated in the operating room and taken to recovery in stable satisfactory condition. He tolerated the operation well counts were correct at the end of the case.   PLAN  OF CARE: Admit to inpatient   PATIENT DISPOSITION:  PACU - hemodynamically stable.   Delay start of Pharmacological VTE agent (>24hrs) due to surgical blood loss or risk of bleeding:  yes

## 2019-10-18 NOTE — Interval H&P Note (Signed)
History and Physical Interval Note:  10/18/2019 7:20 AM  Eric Casey  has presented today for surgery, with the diagnosis of Disc displacement, Lumbar.  The various methods of treatment have been discussed with the patient and family. After consideration of risks, benefits and other options for treatment, the patient has consented to  Procedure(s) with comments: Left Lumbar 4-5 Transforaminal lumbar interbody fusion (Left) - 3C/RM 21 as a surgical intervention.  The patient's history has been reviewed, patient examined, no change in status, stable for surgery.  I have reviewed the patient's chart and labs.  Questions were answered to the patient's satisfaction.     Dorian Heckle

## 2019-10-18 NOTE — Anesthesia Postprocedure Evaluation (Signed)
Anesthesia Post Note  Patient: Eric Casey  Procedure(s) Performed: Left Lumbar Four-Five Transforaminal lumbar interbody fusion (Left Spine Lumbar)     Patient location during evaluation: PACU Anesthesia Type: General Level of consciousness: awake and alert Pain management: pain level controlled Vital Signs Assessment: post-procedure vital signs reviewed and stable Respiratory status: spontaneous breathing, nonlabored ventilation, respiratory function stable and patient connected to nasal cannula oxygen Cardiovascular status: blood pressure returned to baseline and stable Postop Assessment: no apparent nausea or vomiting Anesthetic complications: no   No complications documented.  Last Vitals:  Vitals:   10/18/19 1401 10/18/19 1401  BP: 116/77 116/77  Pulse: 87 90  Resp: 20 20  Temp: 36.7 C 36.7 C  SpO2:  97%    Last Pain:  Vitals:   10/18/19 1401  TempSrc: Oral  PainSc:                  Eric Casey

## 2019-10-18 NOTE — Transfer of Care (Signed)
Immediate Anesthesia Transfer of Care Note  Patient: Eric Casey  Procedure(s) Performed: Left Lumbar Four-Five Transforaminal lumbar interbody fusion (Left Spine Lumbar)  Patient Location: PACU  Anesthesia Type:General  Level of Consciousness: drowsy  Airway & Oxygen Therapy: Patient Spontanous Breathing  Post-op Assessment: Report given to RN and Post -op Vital signs reviewed and stable  Post vital signs: Reviewed and stable  Last Vitals:  Vitals Value Taken Time  BP 127/74 10/18/19 1031  Temp    Pulse 82 10/18/19 1031  Resp 13 10/18/19 1031  SpO2 98 % 10/18/19 1031  Vitals shown include unvalidated device data.  Last Pain:  Vitals:   10/18/19 0631  TempSrc:   PainSc: 0-No pain         Complications: No complications documented.

## 2019-10-18 NOTE — Brief Op Note (Signed)
10/18/2019  10:23 AM  PATIENT:  Eric Casey  44 y.o. 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   POST-OPERATIVE DIAGNOSIS:  Recurrent Disc displacement, Lumbar, spondylosis, stenosis, DDD, lumbago, radiculopathy L 45 level   PROCEDURE:  Procedure(s) with comments: Left Lumbar Four-Five Transforaminal lumbar interbody fusion (Left) - posterior with expandable titanium cage, autograft, pedicle screw fixation, posterolateral arthrodesis  SURGEON:  Surgeon(s) and Role:    Erline Levine, MD - Primary  PHYSICIAN ASSISTANT: Glenford Peers, NP  ASSISTANTS: Poteat, RN   ANESTHESIA:   general  EBL:  150 mL   BLOOD ADMINISTERED:none  DRAINS: none   LOCAL MEDICATIONS USED:  MARCAINE    and LIDOCAINE   SPECIMEN:  No Specimen  DISPOSITION OF SPECIMEN:  N/A  COUNTS:  YES  TOURNIQUET:  * No tourniquets in log *  DICTATION: Patient is 44 year old man with 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 He has a left L5 radiculopathy. It was elected to take him to surgery for left TLIF decompression and fusion at this level.   Procedure: Patient was placed in a prone position on the [place] table after smooth and uncomplicated induction of general endotracheal anesthesia. His low back was prepped and draped in usual sterile fashion with betadine scrub and DuraPrep after localizing anatomy with C arm. Area of incision was infiltrated with local lidocaine. Incision was made to the lumbodorsal fascia was incised and exposure was performed of the L4 through L5 spinous processes laminae facet joint and transverse processes. Intraoperative x-ray was obtained which confirmed correct orientation. A total lleft hemi-laminectomy of L4 was performed with disarticulation of the facet joints at this level and thorough decompression was performed of both L4 and L5 nerve  roots along with the common dural tube. This decompression was more involved than would be typical of that performed for PLIF alone and included painstaking dissection of adherent ligament compressing the thecal sac and wide decompression of all neural elements. There was significant scarring around the thecal sac and left L 5 nerve root. A thorough discectomy was performed on the left with preparation of the endplates for grafting a trial spacer was placed this level. Bone autograft was packed within the interspace on the left along with extra extra small BMP kit. A 46 x 11 x 31 mm expandable titanium TLIF cage was inserted the interspace and countersunk appropriately and opened to 15 degrees lordosis. The posterolateral region was extensively decorticated and pedicle probes were placed at L4 and L5 bilaterally. Intraoperative fluoroscopy confirmed correct orientationin the AP and lateral plane. 46 x 6.5 mm pedicle screws were placed at L5 bilaterally and 46 x 6.5 mm screws placed at L4 bilaterally final x-rays demonstrated well-positioned interbody grafts and pedicle screw fixation. A 46 mm lordotic rod was placed on the right and a 46 mm rod was placed on the left locked down in situ and the posterolateral region was packed with the remaining BMP and bone autograft and 20 cc allograft chips on the right. The wound was irrigated and long-acting Marcaine was injected in the deep musculature. Fascia was closed with 1 Vicryl sutures skin edges were reapproximated 2 and 3-0 Vicryl sutures. The wound is dressed with Dermabond and an occlusive dressing and was extubated in the operating room and taken to recovery in stable satisfactory condition. He tolerated the operation well counts were correct at the end of the case.   PLAN  OF CARE: Admit to inpatient   PATIENT DISPOSITION:  PACU - hemodynamically stable.   Delay start of Pharmacological VTE agent (>24hrs) due to surgical blood loss or risk of bleeding:  yes

## 2019-10-18 NOTE — Anesthesia Preprocedure Evaluation (Signed)
Anesthesia Evaluation  Patient identified by MRN, date of birth, ID band Patient awake    Reviewed: Allergy & Precautions, NPO status , Patient's Chart, lab work & pertinent test results  Airway Mallampati: III  TM Distance: >3 FB Neck ROM: Full    Dental  (+) Teeth Intact, Dental Advisory Given   Pulmonary former smoker,    breath sounds clear to auscultation       Cardiovascular hypertension,  Rhythm:Regular Rate:Normal     Neuro/Psych    GI/Hepatic   Endo/Other    Renal/GU      Musculoskeletal   Abdominal   Peds  Hematology   Anesthesia Other Findings   Reproductive/Obstetrics                             Anesthesia Physical Anesthesia Plan  ASA: III  Anesthesia Plan: General   Post-op Pain Management:    Induction: Intravenous  PONV Risk Score and Plan: Ondansetron and Dexamethasone  Airway Management Planned: Oral ETT  Additional Equipment:   Intra-op Plan:   Post-operative Plan: Extubation in OR  Informed Consent: I have reviewed the patients History and Physical, chart, labs and discussed the procedure including the risks, benefits and alternatives for the proposed anesthesia with the patient or authorized representative who has indicated his/her understanding and acceptance.     Dental advisory given  Plan Discussed with: CRNA and Anesthesiologist  Anesthesia Plan Comments:         Anesthesia Quick Evaluation  

## 2019-10-18 NOTE — Anesthesia Procedure Notes (Signed)
Procedure Name: Intubation Performed by: Ezekiel Ina, CRNA Pre-anesthesia Checklist: Patient identified, Emergency Drugs available, Suction available and Patient being monitored Patient Re-evaluated:Patient Re-evaluated prior to induction Oxygen Delivery Method: Circle System Utilized Preoxygenation: Pre-oxygenation with 100% oxygen Induction Type: IV induction Ventilation: Mask ventilation with difficulty and Two handed mask ventilation required Laryngoscope Size: Miller and 2 Grade View: Grade I Tube type: Oral Tube size: 7.5 mm Number of attempts: 1 Airway Equipment and Method: Stylet and Oral airway Placement Confirmation: ETT inserted through vocal cords under direct vision,  positive ETCO2 and breath sounds checked- equal and bilateral Secured at: 23 cm Tube secured with: Tape Dental Injury: Teeth and Oropharynx as per pre-operative assessment

## 2019-10-18 NOTE — Progress Notes (Signed)
Awake, alert, conversant.  Sore in back.  Full strength both legs.  Doing well.   

## 2019-10-18 NOTE — Progress Notes (Signed)
Orthopedic Tech Progress Note Patient Details:  Eric Casey Dec 06, 1975 [id number] PACU RN said patient has brace Patient ID: Eric Casey, male   DOB: December 10, 1975, 44 y.o.   MRN: [id number]   [name] 10/18/2019, 12:47 PM

## 2019-10-19 NOTE — Discharge Summary (Signed)
Physician Discharge Summary  Patient ID: Eric Casey MRN: [id number] DOB/AGE: [birth year] 44 y.o.  Admit date: 10/18/2019 Discharge date: 10/19/2019  Admission Diagnoses:Recurrent Disc displacement, Lumbar, spondylosis, stenosis, DDD, lumbago, radiculopathy L 45 level    Discharge Diagnoses: Recurrent Disc displacement, Lumbar, spondylosis, stenosis, DDD, lumbago, radiculopathy L 45 level     Discharged Condition: good  Hospital Course: Mr. Lazarus was admitted and taken to the operating room for an uncomplicated lumbar discetomy and fusion at L4/5. Post op he is voiding, ambulating, and tolerating a regular diet. His wound is clean, dry, and without signs of infection.   Treatments: surgery: Left Lumbar Four-Five Transforaminal lumbar interbody fusion (Left) - posterior with expandable titanium cage, autograft, pedicle screw fixation, posterolateral arthrodesis   Discharge Exam: Blood pressure 118/77, pulse 77, temperature 97.8 F (36.6 C), temperature source Oral, resp. rate 18, height 5\' 10"  (1.778 m), weight 117.3 kg, SpO2 99 %. General appearance: alert, cooperative, appears stated age and moderate distress  Disposition: Discharge disposition: 01-Home or Self Care      Disc displacement, Lumbar  Allergies as of 10/19/2019      Reactions   Codeine Hives   Cymbalta [duloxetine Hcl] Other (See Comments)   "Felt strange"   Flexeril [cyclobenzaprine] Other (See Comments)   sedation   Robaxin [methocarbamol] Nausea Only   Zanaflex [tizanidine Hcl] Nausea Only      Medication List    STOP taking these medications   diazepam 5 MG tablet Commonly known as: VALIUM     TAKE these medications   atenolol 25 MG tablet Commonly known as: TENORMIN Take by mouth daily.   baclofen 10 MG tablet Commonly known as: LIORESAL Take 1 tablet (10 mg total) by mouth 2 (two) times daily. What changed: when to take this   celecoxib 200 MG capsule Commonly known  as: CeleBREX Take 1 capsule (200 mg total) by mouth 2 (two) times daily.   Fish Oil 1200 MG Caps Take 1,200 mg daily by mouth.   fluticasone 50 MCG/ACT nasal spray Commonly known as: FLONASE Place 2 sprays into both nostrils daily as needed for allergies or rhinitis.   gabapentin 300 MG capsule Commonly known as: NEURONTIN Take 300 mg by mouth 3 (three) times daily.   loratadine 10 MG tablet Commonly known as: CLARITIN Take 10 mg daily by mouth.   losartan 100 MG tablet Commonly known as: COZAAR Take 100 mg daily by mouth.   oxyCODONE-acetaminophen 10-325 MG tablet Commonly known as: Percocet Take 1 tablet every 4 (four) hours as needed by mouth for pain.   tapentadol 50 MG tablet Commonly known as: Nucynta Take 1 tablet (50 mg total) by mouth every 4 (four) hours as needed for severe pain. What changed:   how much to take  reasons to take this       Follow-up Information    10/21/2019, MD Follow up in 3 week(s).   Specialty: Neurosurgery Why: please call to make an appointment Contact information: [address] [place] [phone]               Signed: [phone] 10/19/2019, 7:57 AM

## 2019-10-19 NOTE — Evaluation (Signed)
Occupational Therapy Evaluation Patient Details Name: Eric Casey MRN: [id number] DOB: 06/10/1975 Today's Date: 10/19/2019    History of Present Illness Pt is a 44 y/o male who presents s/p L4-L5 TLIF on 10/18/2019. PMH significant for HTN, spinal cord stimulator insertion 11/18/2016, prior lumbar microdiscectomy.    Clinical Impression   PTA pt living with family and functioning at mod I level. Pt with history of spinal injury/surgeries and is familiar with precautions. At time of eval, pt able to complete bed mobility and transfers at mod I level. Reviewed safe movement patterns for in/out of bed. Back handout provided and reviewed adls in detail. Pt educated on: clothing between brace, never sleep in brace, set an alarm at night for medication, avoid sitting for long periods of time, correct bed positioning for sleeping, correct sequence for bed mobility, avoiding lifting more than 5 pounds and never wash directly over incision. All education is complete and patient indicates understanding. Wife is also present for education. All OT needs are met, OT will sign off. Thank you for this consult.     Follow Up Recommendations  No OT follow up    Equipment Recommendations  None recommended by OT    Recommendations for Other Services       Precautions / Restrictions Precautions Precautions: Back Precaution Booklet Issued: Yes (comment) Precaution Comments: reviewed in context of ADL Required Braces or Orthoses: Spinal Brace Spinal Brace: Lumbar corset;Applied in sitting position Restrictions Weight Bearing Restrictions: No      Mobility Bed Mobility Overal bed mobility: Modified Independent Bed Mobility: Rolling;Sidelying to Sit           General bed mobility comments: Min cues for optimal log roll technique. HOB flat and rails lowered to simulate home environment.   Transfers Overall transfer level: Modified independent Equipment used: None Transfers: Sit  to/from Stand           General transfer comment: Increased time due to pain but able to complete with good posture and min use of UE's on bed.     Balance Overall balance assessment: No apparent balance deficits (not formally assessed)                                         ADL either performed or assessed with clinical judgement   ADL Overall ADL's : Modified independent                                       General ADL Comments: Pt completed ADL at mod I level. He was able to complete full body dressing without external assist while maintaining precautions. Pt is able to complete figure four method and independent in instructing wife as needed. Pt proficient in brace/don doff. Reviewed safe IADL routines as well     Vision Patient Visual Report: No change from baseline       Perception     Praxis      Pertinent Vitals/Pain Pain Assessment: Faces Faces Pain Scale: Hurts even more Pain Location: Incision site Pain Descriptors / Indicators: Operative site guarding;Sore Pain Intervention(s): Monitored during session;Repositioned     Hand Dominance     Extremity/Trunk Assessment Upper Extremity Assessment Upper Extremity Assessment: Overall WFL for tasks assessed   Lower Extremity Assessment Lower Extremity Assessment: Overall WFL for tasks assessed  Cervical / Trunk Assessment Cervical / Trunk Assessment: Other exceptions Cervical / Trunk Exceptions: s/p surgery   Communication Communication Communication: No difficulties   Cognition Arousal/Alertness: Awake/alert Behavior During Therapy: WFL for tasks assessed/performed Overall Cognitive Status: Within Functional Limits for tasks assessed                                     General Comments       Exercises     Shoulder Instructions      Home Living Family/patient expects to be discharged to:: Private residence Living Arrangements: Spouse/significant  other;Children Available Help at Discharge: Family;Available 24 hours/day Type of Home: House Home Access: Stairs to enter [employer] of Steps: 2   Home Layout: Multi-level Alternate Level Stairs-Number of Steps: split level home   Bathroom Shower/Tub: Occupational psychologist: Standard     Home Equipment: Environmental consultant - 2 wheels;Bedside commode;Shower seat          Prior Functioning/Environment Level of Independence: Independent                 OT Problem List: Decreased knowledge of use of DME or AE;Decreased knowledge of precautions;Pain      OT Treatment/Interventions:      OT Goals(Current goals can be found in the care plan section) Acute Rehab OT Goals Patient Stated Goal: Home this morning OT Goal Formulation: All assessment and education complete, DC therapy  OT Frequency:     Barriers to D/C:            Co-evaluation              AM-PAC OT "6 Clicks" Daily Activity     Outcome Measure Help from another person eating meals?: None Help from another person taking care of personal grooming?: None Help from another person toileting, which includes using toliet, bedpan, or urinal?: None Help from another person bathing (including washing, rinsing, drying)?: None Help from another person to put on and taking off regular upper body clothing?: None Help from another person to put on and taking off regular lower body clothing?: None 6 Click Score: 24   End of Session Equipment Utilized During Treatment: Back brace Nurse Communication: Mobility status  Activity Tolerance: Patient tolerated treatment well Patient left: in bed;with family/visitor present  OT Visit Diagnosis: Other abnormalities of gait and mobility (R26.89)                Time: 0938-1829 OT Time Calculation (min): 12 min Charges:  OT General Charges $OT Visit: 1 Visit OT Evaluation $OT Eval Low Complexity: 1 Low  Zenovia Jarred, MSOT, OTR/L Acute Rehabilitation  Services [facility] Office Number: [phone] Pager: [phone]  Zenovia Jarred 10/19/2019, 8:44 AM

## 2019-10-19 NOTE — Plan of Care (Signed)
Patient alert and oriented, mae's well, voiding adequate amount of urine, swallowing without difficulty, no c/o pain at time of discharge. Patient discharged home with family. Script and discharged instructions given to patient. Patient and family stated understanding of instructions given. Patient has an appointment with Dr.Stern    

## 2019-10-19 NOTE — Evaluation (Signed)
Physical Therapy Evaluation and Discharge Patient Details Name: Eric Casey MRN: [id number] DOB: [birth year] Today's Date: 10/19/2019   History of Present Illness  Pt is a 44 y/o male who presents s/p L4-L5 TLIF on 10/18/2019. PMH significant for HTN, spinal cord stimulator insertion 11/18/2016, prior lumbar microdiscectomy.     Clinical Impression  Patient evaluated by Physical Therapy with no further acute PT needs identified. All education has been completed and the patient has no further questions. Pt was able to demonstrate transfers and ambulation with gross modified independence and no AD. Pt was educated on precautions, brace application/wearing schedule, appropriate activity progression, and car transfer. Pt very familiar with precautions and able to recall without assist, however occasionally requiring cues to maintain precautions - especially with twisting. Pt will have family at home to assist. See below for any follow-up Physical Therapy or equipment needs. PT is signing off. Thank you for this referral.     Follow Up Recommendations No PT follow up;Supervision - Intermittent    Equipment Recommendations  None recommended by PT    Recommendations for Other Services       Precautions / Restrictions Precautions Precautions: Back Precaution Booklet Issued: Yes (comment) Precaution Comments: Reviewed handout, and pt was cued for precautions during functional mobility.  Required Braces or Orthoses: Spinal Brace Spinal Brace: Lumbar corset;Applied in sitting position Restrictions Weight Bearing Restrictions: No      Mobility  Bed Mobility Overal bed mobility: Modified Independent Bed Mobility: Rolling;Sidelying to Sit           General bed mobility comments: Min cues for optimal log roll technique. HOB flat and rails lowered to simulate home environment.   Transfers Overall transfer level: Modified independent Equipment used: None Transfers: Sit to/from  Stand           General transfer comment: Increased time due to pain but able to complete with good posture and min use of UE's on bed.   Ambulation/Gait Ambulation/Gait assistance: Modified independent (Device/Increase time) Gait Distance (Feet): 400 Feet Assistive device: None Gait Pattern/deviations: Step-through pattern;Decreased stride length;Wide base of support Gait velocity: Decreased Gait velocity interpretation: <1.31 ft/sec, indicative of household ambulator General Gait Details: Slow but generally steady without AD. Grossly with wide BOS and slightly flexed knees due to pain. Limited trunk rotation and natural arm swing, but seemed to improve with distance.   Stairs Stairs:  (Pt declined stair training - reports he feels comfortable)          Wheelchair Mobility    Modified Rankin (Stroke Patients Only)       Balance Overall balance assessment: No apparent balance deficits (not formally assessed)                                           Pertinent Vitals/Pain Pain Assessment: Faces Faces Pain Scale: Hurts even more Pain Location: Incision site Pain Descriptors / Indicators: Operative site guarding;Sore Pain Intervention(s): Limited activity within patient's tolerance;Monitored during session;Repositioned    Home Living Family/patient expects to be discharged to:: Private residence Living Arrangements: Spouse/significant other;Children Available Help at Discharge: Family;Available 24 hours/day Type of Home: House Home Access: Stairs to enter   [employer] of Steps: 2 Home Layout: Multi-level (Split level - kitchen and bedrooms upstairs) Home Equipment: Walker - 2 wheels;Bedside commode;Shower seat      Prior Function Level of Independence: Independent  Hand Dominance        Extremity/Trunk Assessment   Upper Extremity Assessment Upper Extremity Assessment: Defer to OT evaluation    Lower  Extremity Assessment Lower Extremity Assessment: Generalized weakness (Consistent with pre-op diagnosis)    Cervical / Trunk Assessment Cervical / Trunk Assessment: Other exceptions Cervical / Trunk Exceptions: s/p surgery  Communication   Communication: No difficulties  Cognition Arousal/Alertness: Awake/alert Behavior During Therapy: WFL for tasks assessed/performed Overall Cognitive Status: Within Functional Limits for tasks assessed                                        General Comments      Exercises     Assessment/Plan    PT Assessment Patent does not need any further PT services  PT Problem List         PT Treatment Interventions      PT Goals (Current goals can be found in the Care Plan section)  Acute Rehab PT Goals Patient Stated Goal: Home this morning PT Goal Formulation: All assessment and education complete, DC therapy    Frequency     Barriers to discharge        Co-evaluation               AM-PAC PT "6 Clicks" Mobility  Outcome Measure Help needed turning from your back to your side while in a flat bed without using bedrails?: None Help needed moving from lying on your back to sitting on the side of a flat bed without using bedrails?: None Help needed moving to and from a bed to a chair (including a wheelchair)?: None Help needed standing up from a chair using your arms (e.g., wheelchair or bedside chair)?: None Help needed to walk in hospital room?: None Help needed climbing 3-5 steps with a railing? : None 6 Click Score: 24    End of Session Equipment Utilized During Treatment: Back brace Activity Tolerance: Patient tolerated treatment well Patient left: with family/visitor present (Pt in bathroom - wife in room) Nurse Communication: Mobility status PT Visit Diagnosis: Pain Pain - part of body:  (back)    Time: 9163-8466 PT Time Calculation (min) (ACUTE ONLY): 19 min   Charges:   PT Evaluation $PT Eval Low  Complexity: 1 Low          Eric Casey, PT, DPT Acute Rehabilitation Services Pager: [phone] Office: [phone]   Eric Casey 10/19/2019, 7:58 AM

## 2019-10-19 NOTE — Discharge Instructions (Addendum)
Wound Care Remove outside dressing in 3 days Leave incision open to air. You may shower. Do not scrub directly on incision.  Do not put any creams, lotions, or ointments on incision. Activity Walk gradually increasing distances out in the fresh air at least twice a day.  Walking additional 6 times inside the house, gradually increasing distances, daily. No bending, lifting, or twisting.  Perform activities between shoulder and waist height (that is at counter height when standing or table height when sitting). Diet Resume your normal diet.  Return to Work Will be discussed at you follow up appointment. Call Your Doctor If Any of These Occur Redness, drainage, or swelling at the wound.  Temperature greater than 101 degrees. Severe pain not relieved by pain medication. Incision starts to come apart. Follow Up Appt Call today for appointment in 2-3 weeks ([phone]) or for problems.  If you have any hardware placed in your spine, you will need an x-ray before your appointment.

## 2019-10-21 ENCOUNTER — Encounter (HOSPITAL_COMMUNITY): Payer: Self-pay | Admitting: Neurosurgery

## 2021-04-05 ENCOUNTER — Other Ambulatory Visit: Payer: Self-pay | Admitting: Neurological Surgery

## 2021-04-05 DIAGNOSIS — M5412 Radiculopathy, cervical region: Secondary | ICD-10-CM

## 2021-04-07 ENCOUNTER — Ambulatory Visit
Admission: RE | Admit: 2021-04-07 | Discharge: 2021-04-07 | Disposition: A | Discharge status: Home / Self Care | Payer: Medicare HMO | Source: Ambulatory Visit | Attending: Neurological Surgery | Admitting: Neurological Surgery

## 2021-04-07 DIAGNOSIS — M5412 Radiculopathy, cervical region: Secondary | ICD-10-CM

## 2021-04-07 MED ORDER — MEPERIDINE HCL 50 MG/ML IJ SOLN: 50.0000 mg | Freq: Once | INTRAMUSCULAR | Status: DC | PRN

## 2021-04-07 MED ORDER — ONDANSETRON HCL 4 MG/2ML IJ SOLN: 4.0000 mg | Freq: Once | INTRAMUSCULAR | Status: DC | PRN

## 2021-04-07 MED ORDER — DIAZEPAM 5 MG PO TABS
10.0000 mg | ORAL_TABLET | Freq: Once | ORAL | Status: AC
  Administered 2021-04-07: 10 mg via ORAL

## 2021-04-07 MED ORDER — IOPAMIDOL (ISOVUE-M 300) INJECTION 61%
10.0000 mL | Freq: Once | INTRAMUSCULAR | Status: AC | PRN
  Administered 2021-04-07: 10 mL via INTRATHECAL

## 2021-04-07 NOTE — Progress Notes (Signed)
Pt reports his spinal cord stimulator has been turned off for the myelogram procedure.  

## 2021-04-07 NOTE — Discharge Instructions (Signed)

## 2021-04-29 ENCOUNTER — Other Ambulatory Visit: Payer: Self-pay | Admitting: Neurological Surgery

## 2021-05-02 ENCOUNTER — Encounter (HOSPITAL_COMMUNITY): Payer: Self-pay | Admitting: Neurological Surgery

## 2021-05-02 NOTE — Progress Notes (Signed)
Pre-op instructions given to pt. Instructed to bring control for spinal cord stimulator.  ?PCP: Adella Nissen in [place] ?

## 2021-05-04 ENCOUNTER — Ambulatory Visit (HOSPITAL_COMMUNITY): Payer: Medicare HMO

## 2021-05-04 ENCOUNTER — Encounter (HOSPITAL_COMMUNITY): Payer: Self-pay | Admitting: Neurological Surgery

## 2021-05-04 ENCOUNTER — Other Ambulatory Visit: Payer: Self-pay

## 2021-05-04 ENCOUNTER — Ambulatory Visit (HOSPITAL_BASED_OUTPATIENT_CLINIC_OR_DEPARTMENT_OTHER): Payer: Medicare HMO | Admitting: Anesthesiology

## 2021-05-04 ENCOUNTER — Observation Stay (HOSPITAL_COMMUNITY)
Admission: RE | Admit: 2021-05-04 | Discharge: 2021-05-05 | Disposition: A | Discharge status: Home / Self Care | Payer: Medicare HMO | Attending: Neurological Surgery | Admitting: Neurological Surgery

## 2021-05-04 ENCOUNTER — Encounter (HOSPITAL_COMMUNITY)
Admission: RE | Disposition: A | Discharge status: Home / Self Care | Payer: Self-pay | Source: Home / Self Care | Attending: Neurological Surgery

## 2021-05-04 ENCOUNTER — Ambulatory Visit (HOSPITAL_COMMUNITY): Payer: Medicare HMO | Admitting: Anesthesiology

## 2021-05-04 DIAGNOSIS — M79622 Pain in left upper arm: Secondary | ICD-10-CM | POA: Diagnosis present

## 2021-05-04 DIAGNOSIS — Z87891 Personal history of nicotine dependence: Secondary | ICD-10-CM | POA: Diagnosis not present

## 2021-05-04 DIAGNOSIS — M5412 Radiculopathy, cervical region: Secondary | ICD-10-CM | POA: Diagnosis present

## 2021-05-04 DIAGNOSIS — M4802 Spinal stenosis, cervical region: Secondary | ICD-10-CM | POA: Insufficient documentation

## 2021-05-04 DIAGNOSIS — M4722 Other spondylosis with radiculopathy, cervical region: Secondary | ICD-10-CM | POA: Diagnosis not present

## 2021-05-04 DIAGNOSIS — M50123 Cervical disc disorder at C6-C7 level with radiculopathy: Secondary | ICD-10-CM | POA: Diagnosis not present

## 2021-05-04 HISTORY — PX: ANTERIOR CERVICAL DECOMP/DISCECTOMY FUSION: SHX1161

## 2021-05-04 HISTORY — DX: Unspecified osteoarthritis, unspecified site: M19.90

## 2021-05-04 LAB — CBC
HCT: 42.8 % (ref 39.0–52.0)
Hemoglobin: 14.9 g/dL (ref 13.0–17.0)
MCH: 31.1 pg (ref 26.0–34.0)
MCHC: 34.8 g/dL (ref 30.0–36.0)
MCV: 89.4 fL (ref 80.0–100.0)
Platelets: 216 10*3/uL (ref 150–400)
RBC: 4.79 MIL/uL (ref 4.22–5.81)
RDW: 12.1 % (ref 11.5–15.5)
WBC: 5.3 10*3/uL (ref 4.0–10.5)
nRBC: 0 % (ref 0.0–0.2)

## 2021-05-04 LAB — TYPE AND SCREEN
ABO/RH(D): B POS
Antibody Screen: NEGATIVE

## 2021-05-04 LAB — BASIC METABOLIC PANEL
Anion gap: 9 (ref 5–15)
BUN: 18 mg/dL (ref 6–20)
CO2: 24 mmol/L (ref 22–32)
Calcium: 9.6 mg/dL (ref 8.9–10.3)
Chloride: 106 mmol/L (ref 98–111)
Creatinine, Ser: 1.27 mg/dL — ABNORMAL HIGH (ref 0.61–1.24)
GFR, Estimated: >60 mL/min (ref >60)
Glucose, Bld: 100 mg/dL — ABNORMAL HIGH (ref 70–99)
Potassium: 4.6 mmol/L (ref 3.5–5.1)
Sodium: 139 mmol/L (ref 135–145)

## 2021-05-04 LAB — SURGICAL PCR SCREEN
MRSA, PCR: NEGATIVE
Staphylococcus aureus: NEGATIVE

## 2021-05-04 SURGERY — ANTERIOR CERVICAL DECOMPRESSION/DISCECTOMY FUSION 1 LEVEL
Anesthesia: General | Site: Neck

## 2021-05-04 MED ORDER — CHLORHEXIDINE GLUCONATE 0.12 % MT SOLN
OROMUCOSAL | Status: AC
  Administered 2021-05-04: 15 mL via OROMUCOSAL
  Filled 2021-05-04: qty 15

## 2021-05-04 MED ORDER — ACETAMINOPHEN 325 MG PO TABS: 650.0000 mg | ORAL_TABLET | ORAL | Status: DC | PRN

## 2021-05-04 MED ORDER — SODIUM CHLORIDE 0.9% FLUSH
3.0000 mL | Freq: Two times a day (BID) | INTRAVENOUS | Status: DC
  Administered 2021-05-04: 3 mL via INTRAVENOUS

## 2021-05-04 MED ORDER — KETOROLAC TROMETHAMINE 15 MG/ML IJ SOLN
15.0000 mg | Freq: Four times a day (QID) | INTRAMUSCULAR | Status: DC
  Administered 2021-05-04 – 2021-05-05 (×3): 15 mg via INTRAVENOUS
  Filled 2021-05-04 (×3): qty 1

## 2021-05-04 MED ORDER — SODIUM CHLORIDE 0.9% FLUSH: 3.0000 mL | INTRAVENOUS | Status: DC | PRN

## 2021-05-04 MED ORDER — ACETAMINOPHEN 10 MG/ML IV SOLN: 1000.0000 mg | Freq: Once | INTRAVENOUS | Status: DC | PRN

## 2021-05-04 MED ORDER — MIDAZOLAM HCL 2 MG/2ML IJ SOLN
INTRAMUSCULAR | Status: AC
  Filled 2021-05-04: qty 2

## 2021-05-04 MED ORDER — TAPENTADOL HCL 50 MG PO TABS
75.0000 mg | ORAL_TABLET | Freq: Four times a day (QID) | ORAL | Status: DC | PRN
  Administered 2021-05-04 – 2021-05-05 (×3): 75 mg via ORAL
  Filled 2021-05-04 (×3): qty 2

## 2021-05-04 MED ORDER — MENTHOL 3 MG MT LOZG: 1.0000 | LOZENGE | OROMUCOSAL | Status: DC | PRN

## 2021-05-04 MED ORDER — PHENYLEPHRINE HCL-NACL 20-0.9 MG/250ML-% IV SOLN
INTRAVENOUS | Status: DC | PRN
  Administered 2021-05-04: 40 ug/min via INTRAVENOUS

## 2021-05-04 MED ORDER — ACETAMINOPHEN 650 MG RE SUPP: 650.0000 mg | RECTAL | Status: DC | PRN

## 2021-05-04 MED ORDER — SODIUM CHLORIDE 0.9 % IV SOLN: 250.0000 mL | INTRAVENOUS | Status: DC

## 2021-05-04 MED ORDER — AMISULPRIDE (ANTIEMETIC) 5 MG/2ML IV SOLN: 10.0000 mg | Freq: Once | INTRAVENOUS | Status: DC | PRN

## 2021-05-04 MED ORDER — ONDANSETRON HCL 4 MG/2ML IJ SOLN
INTRAMUSCULAR | Status: DC | PRN
  Administered 2021-05-04: 4 mg via INTRAVENOUS

## 2021-05-04 MED ORDER — CEFAZOLIN SODIUM-DEXTROSE 2-4 GM/100ML-% IV SOLN
2.0000 g | Freq: Three times a day (TID) | INTRAVENOUS | Status: AC
  Administered 2021-05-04 – 2021-05-05 (×2): 2 g via INTRAVENOUS
  Filled 2021-05-04 (×2): qty 100

## 2021-05-04 MED ORDER — FENTANYL CITRATE (PF) 250 MCG/5ML IJ SOLN
INTRAMUSCULAR | Status: DC | PRN
  Administered 2021-05-04: 100 ug via INTRAVENOUS
  Administered 2021-05-04 (×3): 50 ug via INTRAVENOUS

## 2021-05-04 MED ORDER — OXYCODONE HCL 5 MG PO TABS: 10.0000 mg | ORAL_TABLET | ORAL | Status: DC | PRN

## 2021-05-04 MED ORDER — GELATIN ABSORBABLE MT POWD: OROMUCOSAL | Status: DC | PRN

## 2021-05-04 MED ORDER — LORATADINE 10 MG PO TABS
10.0000 mg | ORAL_TABLET | Freq: Every day | ORAL | Status: DC
Start: 2021-05-05 — End: 2021-05-05
  Administered 2021-05-05: 10 mg via ORAL
  Filled 2021-05-04: qty 1

## 2021-05-04 MED ORDER — PHENYLEPHRINE HCL (PRESSORS) 10 MG/ML IV SOLN
INTRAVENOUS | Status: AC
  Filled 2021-05-04: qty 1

## 2021-05-04 MED ORDER — LOSARTAN POTASSIUM 50 MG PO TABS
100.0000 mg | ORAL_TABLET | Freq: Every day | ORAL | Status: DC
  Administered 2021-05-05: 100 mg via ORAL
  Filled 2021-05-04 (×2): qty 2

## 2021-05-04 MED ORDER — ONDANSETRON HCL 4 MG PO TABS: 4.0000 mg | ORAL_TABLET | Freq: Four times a day (QID) | ORAL | Status: DC | PRN

## 2021-05-04 MED ORDER — CELECOXIB 200 MG PO CAPS
ORAL_CAPSULE | ORAL | Status: AC
  Administered 2021-05-04: 200 mg via ORAL
  Filled 2021-05-04: qty 1

## 2021-05-04 MED ORDER — ROCURONIUM BROMIDE 10 MG/ML (PF) SYRINGE
PREFILLED_SYRINGE | INTRAVENOUS | Status: AC
  Filled 2021-05-04: qty 20

## 2021-05-04 MED ORDER — CHLORHEXIDINE GLUCONATE CLOTH 2 % EX PADS: 6.0000 | MEDICATED_PAD | Freq: Once | CUTANEOUS | Status: DC

## 2021-05-04 MED ORDER — DEXAMETHASONE SODIUM PHOSPHATE 10 MG/ML IJ SOLN
INTRAMUSCULAR | Status: AC
  Filled 2021-05-04: qty 1

## 2021-05-04 MED ORDER — FENTANYL CITRATE (PF) 100 MCG/2ML IJ SOLN
INTRAMUSCULAR | Status: AC
  Filled 2021-05-04: qty 2

## 2021-05-04 MED ORDER — AMITRIPTYLINE HCL 50 MG PO TABS
50.0000 mg | ORAL_TABLET | Freq: Every day | ORAL | Status: DC
  Administered 2021-05-04: 50 mg via ORAL
  Filled 2021-05-04 (×2): qty 1

## 2021-05-04 MED ORDER — CHLORHEXIDINE GLUCONATE 0.12 % MT SOLN: 15.0000 mL | Freq: Once | OROMUCOSAL | Status: AC

## 2021-05-04 MED ORDER — GABAPENTIN 300 MG PO CAPS
300.0000 mg | ORAL_CAPSULE | Freq: Three times a day (TID) | ORAL | Status: DC
  Administered 2021-05-04 – 2021-05-05 (×2): 300 mg via ORAL
  Filled 2021-05-04 (×2): qty 1

## 2021-05-04 MED ORDER — EPHEDRINE SULFATE-NACL 50-0.9 MG/10ML-% IV SOSY
PREFILLED_SYRINGE | INTRAVENOUS | Status: DC | PRN
  Administered 2021-05-04: 5 mg via INTRAVENOUS
  Administered 2021-05-04: 7.5 mg via INTRAVENOUS
  Administered 2021-05-04: 2.5 mg via INTRAVENOUS
  Administered 2021-05-04: 10 mg via INTRAVENOUS

## 2021-05-04 MED ORDER — PROPOFOL 10 MG/ML IV BOLUS
INTRAVENOUS | Status: AC
  Filled 2021-05-04: qty 20

## 2021-05-04 MED ORDER — PHENOL 1.4 % MT LIQD: 1.0000 | OROMUCOSAL | Status: DC | PRN

## 2021-05-04 MED ORDER — FENTANYL CITRATE (PF) 250 MCG/5ML IJ SOLN
INTRAMUSCULAR | Status: AC
  Filled 2021-05-04: qty 5

## 2021-05-04 MED ORDER — FLUTICASONE PROPIONATE 50 MCG/ACT NA SUSP
2.0000 | Freq: Every day | NASAL | Status: DC | PRN
  Filled 2021-05-04: qty 16

## 2021-05-04 MED ORDER — FENTANYL CITRATE (PF) 100 MCG/2ML IJ SOLN
25.0000 ug | INTRAMUSCULAR | Status: DC | PRN
  Administered 2021-05-04 (×2): 50 ug via INTRAVENOUS

## 2021-05-04 MED ORDER — 0.9 % SODIUM CHLORIDE (POUR BTL) OPTIME
TOPICAL | Status: DC | PRN
  Administered 2021-05-04: 1000 mL

## 2021-05-04 MED ORDER — CHLORHEXIDINE GLUCONATE CLOTH 2 % EX PADS
6.0000 | MEDICATED_PAD | Freq: Once | CUTANEOUS | Status: DC
Start: 2021-05-04 — End: 2021-05-04

## 2021-05-04 MED ORDER — ORAL CARE MOUTH RINSE: 15.0000 mL | Freq: Once | OROMUCOSAL | Status: AC

## 2021-05-04 MED ORDER — ATENOLOL 25 MG PO TABS
25.0000 mg | ORAL_TABLET | Freq: Every day | ORAL | Status: DC
Start: 2021-05-05 — End: 2021-05-05
  Administered 2021-05-05: 25 mg via ORAL
  Filled 2021-05-04: qty 1

## 2021-05-04 MED ORDER — ATORVASTATIN CALCIUM 10 MG PO TABS
10.0000 mg | ORAL_TABLET | Freq: Every day | ORAL | Status: DC
  Administered 2021-05-05: 10 mg via ORAL
  Filled 2021-05-04: qty 1

## 2021-05-04 MED ORDER — ACETAMINOPHEN 500 MG PO TABS: 1000.0000 mg | ORAL_TABLET | Freq: Once | ORAL | Status: AC

## 2021-05-04 MED ORDER — ONDANSETRON HCL 4 MG/2ML IJ SOLN
INTRAMUSCULAR | Status: AC
  Filled 2021-05-04: qty 2

## 2021-05-04 MED ORDER — DEXAMETHASONE SODIUM PHOSPHATE 10 MG/ML IJ SOLN
INTRAMUSCULAR | Status: DC | PRN
  Administered 2021-05-04: 10 mg via INTRAVENOUS

## 2021-05-04 MED ORDER — OXYCODONE HCL 5 MG PO TABS: 5.0000 mg | ORAL_TABLET | ORAL | Status: DC | PRN

## 2021-05-04 MED ORDER — LACTATED RINGERS IV SOLN: INTRAVENOUS | Status: DC

## 2021-05-04 MED ORDER — CEFAZOLIN SODIUM-DEXTROSE 2-4 GM/100ML-% IV SOLN
2.0000 g | INTRAVENOUS | Status: AC
  Administered 2021-05-04: 2 g via INTRAVENOUS
  Filled 2021-05-04: qty 100

## 2021-05-04 MED ORDER — THROMBIN 5000 UNITS EX SOLR
CUTANEOUS | Status: AC
  Filled 2021-05-04: qty 5000

## 2021-05-04 MED ORDER — LIDOCAINE 2% (20 MG/ML) 5 ML SYRINGE
INTRAMUSCULAR | Status: DC | PRN
  Administered 2021-05-04: 100 mg via INTRAVENOUS

## 2021-05-04 MED ORDER — ROCURONIUM BROMIDE 10 MG/ML (PF) SYRINGE
PREFILLED_SYRINGE | INTRAVENOUS | Status: DC | PRN
Start: 2021-05-04 — End: 2021-05-04
  Administered 2021-05-04: 60 mg via INTRAVENOUS
  Administered 2021-05-04: 30 mg via INTRAVENOUS

## 2021-05-04 MED ORDER — ONDANSETRON HCL 4 MG/2ML IJ SOLN: 4.0000 mg | Freq: Four times a day (QID) | INTRAMUSCULAR | Status: DC | PRN

## 2021-05-04 MED ORDER — CELECOXIB 200 MG PO CAPS: 200.0000 mg | ORAL_CAPSULE | Freq: Once | ORAL | Status: AC

## 2021-05-04 MED ORDER — DOCUSATE SODIUM 100 MG PO CAPS
100.0000 mg | ORAL_CAPSULE | Freq: Two times a day (BID) | ORAL | Status: DC
  Administered 2021-05-04 – 2021-05-05 (×2): 100 mg via ORAL
  Filled 2021-05-04 (×2): qty 1

## 2021-05-04 MED ORDER — PHENYLEPHRINE 80 MCG/ML (10ML) SYRINGE FOR IV PUSH (FOR BLOOD PRESSURE SUPPORT)
PREFILLED_SYRINGE | INTRAVENOUS | Status: DC | PRN
  Administered 2021-05-04: 80 ug via INTRAVENOUS
  Administered 2021-05-04 (×2): 160 ug via INTRAVENOUS

## 2021-05-04 MED ORDER — TIZANIDINE HCL 4 MG PO TABS
4.0000 mg | ORAL_TABLET | Freq: Three times a day (TID) | ORAL | Status: DC
  Administered 2021-05-04 – 2021-05-05 (×2): 4 mg via ORAL
  Filled 2021-05-04 (×2): qty 1

## 2021-05-04 MED ORDER — LIDOCAINE 2% (20 MG/ML) 5 ML SYRINGE
INTRAMUSCULAR | Status: AC
  Filled 2021-05-04: qty 5

## 2021-05-04 MED ORDER — PROPOFOL 10 MG/ML IV BOLUS
INTRAVENOUS | Status: DC | PRN
  Administered 2021-05-04: 50 mg via INTRAVENOUS
  Administered 2021-05-04: 150 mg via INTRAVENOUS

## 2021-05-04 MED ORDER — KETOROLAC TROMETHAMINE 30 MG/ML IJ SOLN
INTRAMUSCULAR | Status: AC
  Filled 2021-05-04: qty 1

## 2021-05-04 MED ORDER — MIDAZOLAM HCL 2 MG/2ML IJ SOLN
INTRAMUSCULAR | Status: DC | PRN
Start: 2021-05-04 — End: 2021-05-04
  Administered 2021-05-04: 2 mg via INTRAVENOUS

## 2021-05-04 MED ORDER — SUGAMMADEX SODIUM 200 MG/2ML IV SOLN
INTRAVENOUS | Status: DC | PRN
  Administered 2021-05-04: 400 mg via INTRAVENOUS

## 2021-05-04 MED ORDER — ACETAMINOPHEN 500 MG PO TABS
ORAL_TABLET | ORAL | Status: AC
  Administered 2021-05-04: 1000 mg via ORAL
  Filled 2021-05-04: qty 2

## 2021-05-04 SURGICAL SUPPLY — 65 items
APL SKNCLS STERI-STRIP NONHPOA (GAUZE/BANDAGES/DRESSINGS) ×1
BAG COUNTER SPONGE SURGICOUNT (BAG) ×2 IMPLANT
BAG SPNG CNTER NS LX DISP (BAG) ×1
BAND INSRT 18 STRL LF DISP RB (MISCELLANEOUS) ×2
BAND RUBBER #18 3X1/16 STRL (MISCELLANEOUS) ×4 IMPLANT
BENZOIN TINCTURE PRP APPL 2/3 (GAUZE/BANDAGES/DRESSINGS) ×1 IMPLANT
BIT DRILL NEURO 2X3.1 SFT TUCH (MISCELLANEOUS) ×1 IMPLANT
BLADE CLIPPER SURG (BLADE) IMPLANT
BUR CARBIDE MATCH 3.0 (BURR) ×2 IMPLANT
BUR MATCHSTICK NEURO 3.0 LAGG (BURR) ×1 IMPLANT
CABLE BIPOLOR RESECTION CORD (MISCELLANEOUS) ×1 IMPLANT
CANISTER SUCT 3000ML PPV (MISCELLANEOUS) ×2 IMPLANT
CARTRIDGE OIL MAESTRO DRILL (MISCELLANEOUS) ×1 IMPLANT
COVER MAYO STAND STRL (DRAPES) ×4 IMPLANT
DIFFUSER DRILL AIR PNEUMATIC (MISCELLANEOUS) ×2 IMPLANT
DRAPE C-ARM 42X72 X-RAY (DRAPES) ×2 IMPLANT
DRAPE HALF SHEET 40X57 (DRAPES) IMPLANT
DRAPE LAPAROTOMY 100X72X124 (DRAPES) ×2 IMPLANT
DRAPE MICROSCOPE LEICA (MISCELLANEOUS) ×2 IMPLANT
DRILL NEURO 2X3.1 SOFT TOUCH (MISCELLANEOUS) ×2
DRSG OPSITE POSTOP 3X4 (GAUZE/BANDAGES/DRESSINGS) ×1 IMPLANT
DURAPREP 6ML APPLICATOR 50/CS (WOUND CARE) ×2 IMPLANT
ELECT COATED BLADE 2.86 ST (ELECTRODE) ×2 IMPLANT
ELECT REM PT RETURN 9FT ADLT (ELECTROSURGICAL) ×2
ELECTRODE REM PT RTRN 9FT ADLT (ELECTROSURGICAL) ×1 IMPLANT
EVACUATOR 1/8 PVC DRAIN (DRAIN) IMPLANT
GAUZE 4X4 16PLY ~~LOC~~+RFID DBL (SPONGE) IMPLANT
GLOVE BIOGEL PI IND STRL 8 (GLOVE) ×1 IMPLANT
GLOVE BIOGEL PI INDICATOR 8 (GLOVE) ×1
GLOVE ECLIPSE 8.0 STRL XLNG CF (GLOVE) ×4 IMPLANT
GLOVE EXAM NITRILE LRG STRL (GLOVE) IMPLANT
GLOVE EXAM NITRILE XL STR (GLOVE) IMPLANT
GLOVE EXAM NITRILE XS STR PU (GLOVE) IMPLANT
GLOVE SURG ENC MOIS LTX SZ8 (GLOVE) ×2 IMPLANT
GLOVE SURG UNDER POLY LF SZ8.5 (GLOVE) ×2 IMPLANT
GOWN STRL REUS W/ TWL LRG LVL3 (GOWN DISPOSABLE) IMPLANT
GOWN STRL REUS W/ TWL XL LVL3 (GOWN DISPOSABLE) ×2 IMPLANT
GOWN STRL REUS W/TWL 2XL LVL3 (GOWN DISPOSABLE) IMPLANT
GOWN STRL REUS W/TWL LRG LVL3 (GOWN DISPOSABLE)
GOWN STRL REUS W/TWL XL LVL3 (GOWN DISPOSABLE) ×4
HEMOSTAT POWDER KIT SURGIFOAM (HEMOSTASIS) ×2 IMPLANT
KIT BASIN OR (CUSTOM PROCEDURE TRAY) ×2 IMPLANT
KIT TURNOVER KIT B (KITS) ×2 IMPLANT
NDL SPNL 18GX3.5 QUINCKE PK (NEEDLE) ×1 IMPLANT
NEEDLE HYPO 22GX1.5 SAFETY (NEEDLE) ×2 IMPLANT
NEEDLE SPNL 18GX3.5 QUINCKE PK (NEEDLE) ×4 IMPLANT
NS IRRIG 1000ML POUR BTL (IV SOLUTION) ×2 IMPLANT
OIL CARTRIDGE MAESTRO DRILL (MISCELLANEOUS) ×2
PACK LAMINECTOMY NEURO (CUSTOM PROCEDURE TRAY) ×2 IMPLANT
PAD ARMBOARD 7.5X6 YLW CONV (MISCELLANEOUS) ×6 IMPLANT
PIN DISTRACTION 14MM (PIN) ×4 IMPLANT
PLATE ANT CERV OZARK 22 1LVL (Plate) ×1 IMPLANT
PUTTY BONE 100 VESUVIUS 1CC (Putty) ×1 IMPLANT
SCREW FIXED ST OZARK 4X16 (Screw) ×2 IMPLANT
SCREW VA ST OZARK 4X16 (Plate) ×2 IMPLANT
SPACER ANGLD CASCAD 16X13X8 7D (Spacer) ×1 IMPLANT
SPONGE INTESTINAL PEANUT (DISPOSABLE) ×3 IMPLANT
SPONGE SURGIFOAM ABS GEL SZ50 (HEMOSTASIS) ×2 IMPLANT
STAPLER VISISTAT 35W (STAPLE) IMPLANT
STRIP CLOSURE SKIN 1/2X4 (GAUZE/BANDAGES/DRESSINGS) ×2 IMPLANT
TAPE SURG TRANSPORE 1 IN (GAUZE/BANDAGES/DRESSINGS) ×1 IMPLANT
TAPE SURGICAL TRANSPORE 1 IN (GAUZE/BANDAGES/DRESSINGS) ×2
TOWEL GREEN STERILE (TOWEL DISPOSABLE) ×2 IMPLANT
TOWEL GREEN STERILE FF (TOWEL DISPOSABLE) ×2 IMPLANT
WATER STERILE IRR 1000ML POUR (IV SOLUTION) ×2 IMPLANT

## 2021-05-04 NOTE — Progress Notes (Signed)
Orthopedic Tech Progress Note ?Patient Details:  ?Rolene Course ?07-18-1975 ?[id number] ? ?PACU RN called requesting an ASPEN CERVICAL COLLAR, called in order to [facility]  ? ?Patient ID: Om Lizotte, male   DOB: September 05, 1975, 46 y.o.   MRN: [id number] ? ?[name] ?05/04/2021, 6:21 PM ? ?

## 2021-05-04 NOTE — Anesthesia Procedure Notes (Addendum)
Procedure Name: Intubation ?Date/Time: 05/04/2021 4:02 PM ?Performed by: Lorie Phenix, CRNA ?Pre-anesthesia Checklist: Patient identified, Emergency Drugs available, Suction available and Patient being monitored ?Patient Re-evaluated:Patient Re-evaluated prior to induction ?Oxygen Delivery Method: Circle system utilized ?Preoxygenation: Pre-oxygenation with 100% oxygen ?Induction Type: IV induction ?Ventilation: Mask ventilation without difficulty ?Laryngoscope Size: Mac and 4 ?Grade View: Grade II ?Tube type: Oral ?Tube size: 7.5 mm ?Number of attempts: 1 ?Airway Equipment and Method: Stylet ?Placement Confirmation: ETT inserted through vocal cords under direct vision, positive ETCO2 and breath sounds checked- equal and bilateral ?Secured at: 23 cm ?Tube secured with: Tape ?Dental Injury: Teeth and Oropharynx as per pre-operative assessment  ?Comments: Maintained neck neutrality during direct laryngoscopy ? ? ? ? ?

## 2021-05-04 NOTE — Transfer of Care (Signed)
Immediate Anesthesia Transfer of Care Note ? ?Patient: Jamarkis Branam ? ?Procedure(s) Performed: Anterior Cervical Decompression Discectomy Fusion Cervical six-seven (Neck) ? ?Patient Location: PACU ? ?Anesthesia Type:General ? ?Level of Consciousness: awake and alert  ? ?Airway & Oxygen Therapy: Patient Spontanous Breathing ? ?Post-op Assessment: Report given to RN and Post -op Vital signs reviewed and stable ? ?Post vital signs: Reviewed and stable ? ?Last Vitals:  ?Vitals Value Taken Time  ?BP 142/59 05/04/21 1808  ?Temp    ?Pulse 91 05/04/21 1810  ?Resp 16 05/04/21 1810  ?SpO2 94 % 05/04/21 1810  ?Vitals shown include unvalidated device data. ? ?Last Pain:  ?Vitals:  ? 05/04/21 1212  ?TempSrc:   ?PainSc: 6   ?   ? ?Patients Stated Pain Goal: 0 (05/04/21 1212) ? ?Complications: No notable events documented. ?

## 2021-05-04 NOTE — Op Note (Signed)
? ?Providing Compassionate, Quality Care - Together ? ?Date of service: 05/04/2021 ? ?PREOP DIAGNOSIS: Cervical spondylosis with radiculopathy, C6-7, left with severe left foraminal stenosis due to herniated nucleus pulposus ? ?POSTOP DIAGNOSIS: Same ? ?PROCEDURE: ?1. Arthrodesis C6-7, anterior interbody technique  ?2. Placement of intervertebral biomechanical device, K2 M [place] titanium 8 x 13 x 16 mm ?3. Placement of anterior instrumentation consisting of interbody plate and screws -[place] 22 mm; 4.0 x 16 mm screws bilaterally at C6 and C7 ?4. Discectomy at C6-7 for decompression of spinal cord and exiting nerve roots  ?5. Use of morselized bone allograft  ?6. Use of autograft, same incision ?7. Use of intraoperative microscope ? ?SURGEON: Dr. Kendell Bane Emelie Newsom, DO ? ?ASSISTANT: Docia Barrier, NP ? ?ANESTHESIA: General Endotracheal ? ?EBL: 50 cc ? ?SPECIMENS: None ? ?DRAINS: None ? ?COMPLICATIONS: None immediate ? ?CONDITION: Hemodynamically stable to PACU ? ?HISTORY: ?Eric Casey is a 46 y.o. y.o. male who initially presented to the outpatient clinic with signs and symptoms consistent with left upper extremity C7 radiculopathy.CT myelogram revealed a large left paracentral and foraminal disc protrusion at C6-7 causing severe foraminal stenosis.  He had weakness in his triceps and grip offered him surgical intervention in the form of an ACDF C6-7. Treatment options were discussed including epidural steroid injections, pain control or surgical intervention. After all questions were answered, informed consent was obtained. ? ?PROCEDURE IN DETAIL: ?The patient was brought to the operating room and transferred to the operative table. After induction of general anesthesia, the patient was positioned on the operative table in the supine position with all pressure points meticulously padded. The skin of the neck was then prepped and draped in the usual sterile fashion.  Physician driven timeout was  performed. ? ?After timeout was conducted, skin incision was then made sharply with a 10 blade and Bovie electrocautery was used to dissect the subcutaneous tissue until the platysma was identified. The platysma was then divided and undermined. The sternocleidomastoid muscle was then identified and, utilizing natural fascial planes in the neck, the prevertebral fascia was identified and the carotid sheath was retracted laterally and the trachea and esophagus retracted medially. Again using fluoroscopy, the correct disc space was identified. Bovie electrocautery was used to dissect in the subperiosteal plane and elevate the bilateral longus coli muscles. Self-retaining retractors were then placed under the longus coli muscles bilaterally. At this point, the microscope was draped and brought into the field, and the remainder of the case was done under the microscope using microdissecting technique. ? ?Distraction pins were placed in midline above and below the disc space.  The disc  space was placed in distraction.  The disc space was incised sharply and rongeurs were use to initially complete a discectomy. The high-speed drill was then used to complete discectomy until the posterior annulus was identified and removed and the posterior longitudinal ligament was identified. Using microcurettes, the PLL was elevated, and Kerrison rongeurs were used to remove the posterior longitudinal ligament and the ventral thecal sac was identified. Using a combination of curettes and ronguers, complete decompression of the thecal sac and exiting nerve roots at this level was completed, and verified using micro-nerve hook. The disc space was taken out of distraction.  A very large left foraminal disc herniation was from the foramen using micro nerve hook. ? ?Having completed our decompression, attention was turned to placement of the intervertebral device. Trial spacers were used to select a 8 mm graft. This graft was then filled  with morcellized allograft, and inserted under live fluoroscopy. ? ?After placement of the intervertebral device, the above anterior cervical plate was selected, and placed across the interspace. Using a high-speed drill, the cortex of the cervical vertebral bodies was punctured, and screws inserted in the level above and below at C6 and C7. Final fluoroscopic images in AP and lateral projections were taken to confirm good hardware placement.  The plate was final tightened to the manufacturer's recommendation and the screws were locked in place. ? ?At this point, after all counts were verified to be correct, meticulous hemostasis was secured using a combination of bipolar electrocautery and passive hemostatics.  Skin was closed with staples.  Sterile dressing was applied. ? ?The patient tolerated the procedure well and was extubated in the room and taken to the postanesthesia care unit in stable condition. ? ?

## 2021-05-04 NOTE — H&P (Signed)
? ? ?Providing Compassionate, Quality Care - Together ? ?NEUROSURGERY HISTORY & PHYSICAL ? ? ?Eric Casey is an 46 y.o. male.   ?Chief Complaint: Left upper extremity pain with weakness ?HPI: This is a 46 year old male with severe left upper extremity pain and weakness in his tricep and radiating pain down to his fingertips.  He also complains of bilateral interscapular pain.  CT myelogram revealed severe left lateral recess and foraminal disc protrusion causing severe stenosis without large uncovertebral osteophyte.  There was abutment of the spinal cord.  Given this and his weakness, he presents today for surgical intervention. ? ?Past Medical History:  ?Diagnosis Date  ? Arthritis   ? Back pain, chronic   ? Buttock pain   ? Complication of anesthesia   ? High blood pressure   ? PONV (postoperative nausea and vomiting)   ? ? ?Past Surgical History:  ?Procedure Laterality Date  ? MICRODISCECTOMY LUMBAR    ? PILONIDAL CYST EXCISION    ? SPINAL CORD STIMULATOR INSERTION N/A 11/18/2016  ? Procedure: LUMBAR SPINAL CORD STIMULATOR INSERTION;  Surgeon: Odette Fraction, MD;  Location: [facility] OR;  Service: Neurosurgery;  Laterality: N/A;  ? TRANSFORAMINAL LUMBAR INTERBODY FUSION (TLIF) WITH PEDICLE SCREW FIXATION 1 LEVEL Left 10/18/2019  ? Procedure: Left Lumbar Four-Five Transforaminal lumbar interbody fusion;  Surgeon: Maeola Harman, MD;  Location: [facility] OR;  Service: Neurosurgery;  Laterality: Left;  posterior  ? ? ?Family History  ?Problem Relation Age of Onset  ? Heart disease Father   ? Hypertension Father   ? ?Social History:  reports that he quit smoking about 15 years ago. His smoking use included cigarettes. He has never used smokeless tobacco. He reports that he does not drink alcohol and does not use drugs. ? ?Allergies:  ?Allergies  ?Allergen Reactions  ? Codeine Hives  ? Cymbalta [Duloxetine Hcl] Other (See Comments)  ?  "Felt strange"  ? Flexeril [Cyclobenzaprine] Other (See Comments)  ?  sedation  ?  Robaxin [Methocarbamol] Nausea Only  ? Zanaflex [Tizanidine Hcl] Nausea Only  ? ? ?Medications Prior to Admission  ?Medication Sig Dispense Refill  ? amitriptyline (ELAVIL) 50 MG tablet Take 50 mg by mouth at bedtime.    ? atenolol (TENORMIN) 25 MG tablet Take 25 mg by mouth daily.    ? atorvastatin (LIPITOR) 10 MG tablet Take 10 mg by mouth daily.    ? diclofenac (VOLTAREN) 75 MG EC tablet Take 75 mg by mouth 2 (two) times daily.    ? fluticasone (FLONASE) 50 MCG/ACT nasal spray Place 2 sprays into both nostrils daily as needed for allergies or rhinitis.    ? gabapentin (NEURONTIN) 300 MG capsule Take 300 mg by mouth 3 (three) times daily.    ? loratadine (CLARITIN) 10 MG tablet Take 10 mg by mouth daily.    ? losartan (COZAAR) 100 MG tablet Take 100 mg daily by mouth.    ? tapentadol (NUCYNTA) 50 MG TABS tablet Take 1 tablet (50 mg total) by mouth every 4 (four) hours as needed for severe pain. 150 tablet 0  ? tiZANidine (ZANAFLEX) 4 MG tablet Take 4 mg by mouth 3 (three) times daily.    ? ? ?Results for orders placed or performed during the hospital encounter of 05/04/21 (from the past 48 hour(s))  ?Type and screen     Status: None  ? Collection Time: 05/04/21  1:00 PM  ?Result Value Ref Range  ? ABO/RH(D) B POS   ? Antibody Screen NEG   ?  Sample Expiration    ?  05/07/2021,2359 ?Performed at [facility], [address] [place], [place] [address] ?  ?Basic metabolic panel per protocol     Status: Abnormal  ? Collection Time: 05/04/21  1:20 PM  ?Result Value Ref Range  ? Sodium 139 135 - 145 mmol/L  ? Potassium 4.6 3.5 - 5.1 mmol/L  ? Chloride 106 98 - 111 mmol/L  ? CO2 24 22 - 32 mmol/L  ? Glucose, Bld 100 (H) 70 - 99 mg/dL  ?  Comment: Glucose reference range applies only to samples taken after fasting for at least 8 hours.  ? BUN 18 6 - 20 mg/dL  ? Creatinine, Ser 1.27 (H) 0.61 - 1.24 mg/dL  ? Calcium 9.6 8.9 - 10.3 mg/dL  ? GFR, Estimated >60 >60 mL/min  ?  Comment: (NOTE) ?Calculated using the CKD-EPI  Creatinine Equation (2021) ?  ? Anion gap 9 5 - 15  ?  Comment: Performed at [facility], [address] [place], [place] [address]  ?CBC per protocol     Status: None  ? Collection Time: 05/04/21  1:20 PM  ?Result Value Ref Range  ? WBC 5.3 4.0 - 10.5 K/uL  ? RBC 4.79 4.22 - 5.81 MIL/uL  ? Hemoglobin 14.9 13.0 - 17.0 g/dL  ? HCT 42.8 39.0 - 52.0 %  ? MCV 89.4 80.0 - 100.0 fL  ? MCH 31.1 26.0 - 34.0 pg  ? MCHC 34.8 30.0 - 36.0 g/dL  ? RDW 12.1 11.5 - 15.5 %  ? Platelets 216 150 - 400 K/uL  ? nRBC 0.0 0.0 - 0.2 %  ?  Comment: Performed at [facility], [address] [place], [place] [address]  ? ?No results found. ? ?ROS ?All pertinent positives and negatives are listed in HPI above was at ? ?Blood pressure 126/88, pulse 72, temperature 97.8 ?F (36.6 ?C), temperature source Oral, resp. rate 18, height 5\' 10"  (1.778 m), weight 113.4 kg, SpO2 98 %. ?Physical Exam  ?AAOx3 ?PERRL ?CN 2-12 intact ?RUE 5/5 ?LUE tri/grip 4-/5, otherwise 5/5 ?LUE C7 derm decreased sensation ?BLE 5/5 ?Speech fluent and appropriate ? ?Assessment/Plan ?46 year old male with ? ?C6 7 large foraminal disc protrusion with severe radiculopathy ? ? ?-OR today for C6-7 ACDF.  We discussed all risks, benefits and expected outcomes as well as alternatives to treatment.  Informed consent was obtained. ? ?Thank you for allowing me to participate in this patient's care.  Please do not hesitate to call with questions or concerns. ? ? ?Shalin Linders, DO ?Neurosurgeon ?[facility] ?Cell: [phone] ? ? ? ? ?

## 2021-05-04 NOTE — Anesthesia Preprocedure Evaluation (Addendum)
Anesthesia Evaluation  ?Patient identified by MRN, date of birth, ID band ?Patient awake ? ? ? ?Reviewed: ?Allergy & Precautions, NPO status , Patient's Chart, lab work & pertinent test results ? ?History of Anesthesia Complications ?(+) PONV and history of anesthetic complications ? ?Airway ?Mallampati: II ? ?TM Distance: >3 FB ?Neck ROM: Limited ? ? ? Dental ?no notable dental hx. ? ?  ?Pulmonary ?neg pulmonary ROS, former smoker,  ?  ?Pulmonary exam normal ? ? ? ? ? ? ? Cardiovascular ?hypertension, Pt. on medications and Pt. on home beta blockers ? ?Rhythm:Regular Rate:Normal ? ? ?  ?Neuro/Psych ?negative neurological ROS ? negative psych ROS  ? GI/Hepatic ?negative GI ROS, Neg liver ROS,   ?Endo/Other  ?negative endocrine ROS ? Renal/GU ?negative Renal ROS  ?negative genitourinary ?  ?Musculoskeletal ? ?(+) Arthritis , Cervical radiculopathy   ? Abdominal ?Normal abdominal exam  (+)   ?Peds ? Hematology ?negative hematology ROS ?(+)   ?Anesthesia Other Findings ? ? Reproductive/Obstetrics ? ?  ? ? ? ? ? ? ? ? ? ? ? ? ? ?  ?  ? ? ? ? ? ? ? ? ?Anesthesia Physical ?Anesthesia Plan ? ?ASA: 2 ? ?Anesthesia Plan: General  ? ?Post-op Pain Management: Celebrex PO (pre-op)* and Tylenol PO (pre-op)*  ? ?Induction: Intravenous ? ?PONV Risk Score and Plan: 3 and Ondansetron, Dexamethasone, Midazolam and Treatment may vary due to age or medical condition ? ?Airway Management Planned: Mask and Oral ETT ? ?Additional Equipment: None ? ?Intra-op Plan:  ? ?Post-operative Plan: Extubation in OR ? ?Informed Consent: I have reviewed the patients History and Physical, chart, labs and discussed the procedure including the risks, benefits and alternatives for the proposed anesthesia with the patient or authorized representative who has indicated his/her understanding and acceptance.  ? ? ? ?Dental advisory given ? ?Plan Discussed with: CRNA ? ?Anesthesia Plan Comments: (Lab Results ?     Component                 Value               Date                 ?     WBC                      5.3                 05/04/2021           ?     HGB                      14.9                05/04/2021           ?     HCT                      42.8                05/04/2021           ?     MCV                      89.4                05/04/2021           ?  PLT                      216                 05/04/2021           ?Lab Results ?     Component                Value               Date                 ?     NA                       139                 05/04/2021           ?     K                        4.6                 05/04/2021           ?     CO2                      24                  05/04/2021           ?     GLUCOSE                  100 (H)             05/04/2021           ?     BUN                      18                  05/04/2021           ?     CREATININE               1.27 (H)            05/04/2021           ?     CALCIUM                  9.6                 05/04/2021           ?     GFRNONAA                 >60                 05/04/2021          )  ? ? ? ? ? ?Anesthesia Quick Evaluation ? ?

## 2021-05-05 ENCOUNTER — Encounter (HOSPITAL_COMMUNITY): Payer: Self-pay | Admitting: Neurological Surgery

## 2021-05-05 DIAGNOSIS — M50123 Cervical disc disorder at C6-C7 level with radiculopathy: Secondary | ICD-10-CM | POA: Diagnosis not present

## 2021-05-05 NOTE — Plan of Care (Signed)
Pt doing well. Pt and wife given D/C instructions with verbal understanding. Rx's were sent to the pharmacy by MD. Pt's incision is clean and dry with no sign of infection. Pt's IV was removed prior to D/C. Pt D/C'd home via wheelchair per MD order. Pt is stable @ D/C and has no other needs at this time. Shelsey Rieth, RN  

## 2021-05-05 NOTE — Progress Notes (Signed)
PT Cancellation Note and Discharge ? ?Patient Details ?Name: Eric Casey ?MRN: [id number] ?DOB: July 21, 1975 ? ? ?Cancelled Treatment:    Reason Eval/Treat Not Completed: PT screened, no needs identified, will sign off. Pt currently functioning at a mod I level and reports he has no concerns with being able to negotiate stairs in his home. Pt reports he has no further questions at this time. If needs change, please reconsult.  ? ? ?Thelma Comp ?05/05/2021, 8:43 AM ? ?Rolinda Roan, PT, DPT ?Acute Rehabilitation Services ?Sec[employer]erred ?Office: [phone] ? ?

## 2021-05-05 NOTE — Evaluation (Signed)
Occupational Therapy Evaluation ?Patient Details ?Name: Eric Casey ?MRN: [id number] ?DOB: 06-09-1975 ?Today's Date: 05/05/2021 ? ? ?History of Present Illness Pt is a 46 y/o M presenting with LUE pain and weakness. CT myelogram revealing L lateral recess and foraminal disc protrusion. S/p C6-7 ACDF. PMH includes arthirits, back pain, and HTN  ? ?Clinical Impression ?  ?PTA, pt independent with ADLs and functional mobility, lives with spouse who can provide 24/7 assist at d/c. Pt currently mod I -min A for ADLs, mod I for transfers and bed mobility using log rolling technique. Pt and family member educated on compensatory strategies for dressing, grooming, and bathing, along with wear schedule/care of hard collar. Pt and spouse verbalized understanding. Pt presenting with impairments listed below, however has no acute OT needs at this time, will s/o. Recommend d/c home with assistance.  ?   ? ?Recommendations for follow up therapy are one component of a multi-disciplinary discharge planning process, led by the attending physician.  Recommendations may be updated based on patient status, additional functional criteria and insurance authorization.  ? ?Follow Up Recommendations ? No OT follow up  ?  ?Assistance Recommended at Discharge Set up Supervision/Assistance  ?Patient can return home with the following A little help with bathing/dressing/bathroom;Assistance with cooking/housework;Help with stairs or ramp for entrance;Assist for transportation ? ?  ?Functional Status Assessment ? Patient has had a recent decline in their functional status and demonstrates the ability to make significant improvements in function in a reasonable and predictable amount of time.  ?Equipment Recommendations ? None recommended by OT  ?  ?Recommendations for Other Services   ? ? ?  ?Precautions / Restrictions Precautions ?Precautions: Cervical ?Precaution Booklet Issued: Yes (comment) ?Precaution Comments: pt can recall 3/3  precautions ?Required Braces or Orthoses: Cervical Brace ?Cervical Brace: Hard collar;At all times ?Restrictions ?Weight Bearing Restrictions: No  ? ?  ? ?Mobility Bed Mobility ?Overal bed mobility: Modified Independent ?  ?  ?  ?  ?  ?  ?General bed mobility comments: uses log rolling technique ?  ? ?Transfers ?Overall transfer level: Modified independent ?Equipment used: None ?  ?  ?  ?  ?  ?  ?  ?  ?  ? ?  ?Balance Overall balance assessment: No apparent balance deficits (not formally assessed) ?  ?  ?  ?  ?  ?  ?  ?  ?  ?  ?  ?  ?  ?  ?  ?  ?  ?  ?   ? ?ADL either performed or assessed with clinical judgement  ? ?ADL Overall ADL's : Needs assistance/impaired ?Eating/Feeding: Sitting;Modified independent ?  ?Grooming: Modified independent;Oral care;Standing ?  ?Upper Body Bathing: Minimal assistance;Sitting;Standing ?  ?Lower Body Bathing: Minimal assistance;Sitting/lateral leans;Sit to/from stand ?  ?Upper Body Dressing : Supervision/safety;Sitting;Standing ?  ?Lower Body Dressing: Sitting/lateral leans;Sit to/from stand;Minimal assistance;Cueing for back precautions ?Lower Body Dressing Details (indicate cue type and reason): assist for socks ?Toilet Transfer: Modified Independent;Ambulation;Regular Toilet ?Toilet Transfer Details (indicate cue type and reason): simulated in room ?  ?  ?  ?  ?Functional mobility during ADLs: Modified independent ?   ? ? ? ?Vision   ?Vision Assessment?: No apparent visual deficits  ?   ?Perception   ?  ?Praxis   ?  ? ?Pertinent Vitals/Pain Pain Assessment ?Pain Assessment: No/denies pain  ? ? ? ?Hand Dominance   ?  ?Extremity/Trunk Assessment Upper Extremity Assessment ?Upper Extremity Assessment: Overall WFL for tasks assessed (pt  reporting LUE pain/weakness has resolved since surgery) ?  ?Lower Extremity Assessment ?Lower Extremity Assessment: Overall WFL for tasks assessed ?  ?Cervical / Trunk Assessment ?Cervical / Trunk Assessment: Neck Surgery ?  ?Communication  Communication ?Communication: No difficulties ?  ?Cognition Arousal/Alertness: Awake/alert ?Behavior During Therapy: [facility] for tasks assessed/performed ?Overall Cognitive Status: Within Functional Limits for tasks assessed ?  ?  ?  ?  ?  ?  ?  ?  ?  ?  ?  ?  ?  ?  ?  ?  ?General Comments: pt requiring mild redirection at times ?  ?  ?General Comments  VSS on RA, wife present in room ? ?  ?Exercises   ?  ?Shoulder Instructions    ? ? ?Home Living Family/patient expects to be discharged to:: Private residence ?Living Arrangements: Spouse/significant other;Children ?Available Help at Discharge: Family;Available 24 hours/day ?Type of Home: House ?Home Access: Stairs to enter ?Entrance Stairs-Number of Steps: 2 ?  ?Home Layout: Multi-level ?  ?  ?Bathroom Shower/Tub: Tub/shower unit;Curtain ?  ?Bathroom Toilet: Standard ?  ?  ?Home Equipment: Rolling Walker (2 wheels);BSC/3in1;Crutches ?  ?  ?  ? ?  ?Prior Functioning/Environment Prior Level of Function : Independent/Modified Independent ?  ?  ?  ?  ?  ?  ?Mobility Comments: no AD use ?ADLs Comments: does IADLs ?  ? ?  ?  ?OT Problem List: Decreased range of motion;Decreased strength;Decreased knowledge of precautions ?  ?   ?OT Treatment/Interventions:    ?  ?OT Goals(Current goals can be found in the care plan section) Acute Rehab OT Goals ?Patient Stated Goal: none stated ?OT Goal Formulation: With patient ?Time For Goal Achievement: 05/19/21 ?Potential to Achieve Goals: Good  ?OT Frequency:   ?  ? ?Co-evaluation   ?  ?  ?  ?  ? ?  ?AM-PAC OT "6 Clicks" Daily Activity     ?Outcome Measure Help from another person eating meals?: None ?Help from another person taking care of personal grooming?: None ?Help from another person toileting, which includes using toliet, bedpan, or urinal?: None ?Help from another person bathing (including washing, rinsing, drying)?: A Little ?Help from another person to put on and taking off regular upper body clothing?: A Little ?Help from  another person to put on and taking off regular lower body clothing?: A Little ?6 Click Score: 21 ?  ?End of Session Equipment Utilized During Treatment: Cervical collar ?Nurse Communication: Mobility status ? ?Activity Tolerance: Patient tolerated treatment well ?Patient left: in bed;with call bell/phone within reach;with family/visitor present ? ?OT Visit Diagnosis: Muscle weakness (generalized) (M62.81)  ?              ?Time: [phone] ?OT Time Calculation (min): 14 min ?Charges:  OT General Charges ?$OT Visit: 1 Visit ?OT Evaluation ?$OT Eval Low Complexity: 1 Low ? ?Lynnda Child, OTD, OTR/L ?Acute Rehab ?[phone] ? ?Kaylyn Lim ?05/05/2021, 8:25 AM ?

## 2021-05-05 NOTE — Anesthesia Postprocedure Evaluation (Signed)
Anesthesia Post Note ? ?Patient: Beecher Furio ? ?Procedure(s) Performed: Anterior Cervical Decompression Discectomy Fusion Cervical six-seven (Neck) ? ?  ? ?Patient location during evaluation: PACU ?Anesthesia Type: General ?Level of consciousness: awake and alert ?Pain management: pain level controlled ?Vital Signs Assessment: post-procedure vital signs reviewed and stable ?Respiratory status: spontaneous breathing, nonlabored ventilation, respiratory function stable and patient connected to nasal cannula oxygen ?Cardiovascular status: blood pressure returned to baseline and stable ?Postop Assessment: no apparent nausea or vomiting ?Anesthetic complications: no ? ? ?No notable events documented. ? ?Last Vitals:  ?Vitals:  ? 05/05/21 0402 05/05/21 0721  ?BP: 105/70 118/67  ?Pulse: 88 99  ?Resp: 20 18  ?Temp: 36.5 ?C 36.9 ?C  ?SpO2: 95% 96%  ?  ?Last Pain:  ?Vitals:  ? 05/05/21 0910  ?TempSrc:   ?PainSc: 7   ? ? ?  ?  ?  ?  ?  ?  ? ?Nelle Don Rudell Marlowe ? ? ? ? ?

## 2021-05-05 NOTE — Discharge Summary (Signed)
Physician Discharge Summary  ?Patient ID: ?Eric Casey ?MRN: [id number] ?DOB/AGE: [birth year] 46 y.o. ? ?Admit date: 05/04/2021 ?Discharge date: 05/05/2021 ? ?Admission Diagnoses: Cervical spondylosis with radiculopathy, C6-7, left with severe left foraminal stenosis due to herniated nucleus pulposus ? ?Discharge Diagnoses: Cervical spondylosis with radiculopathy, C6-7, left with severe left foraminal stenosis due to herniated nucleus pulposus ?Principal Problem: ?  Cervical radiculopathy at C7 ? ? ?Discharged Condition: good ? ?Hospital Casey: The patient was admitted on 05/04/2021 and taken to the operating room where the patient underwent ACDF C6/7. The patient tolerated the procedure well and was taken to the recovery room and then to the floor in stable condition. The hospital Casey was routine. There were no complications. The wound remained clean dry and intact. Pt had appropriate neck soreness. No complaints of arm pain or new N/T/W. The patient remained afebrile with stable vital signs, and tolerated a regular diet. The patient continued to increase activities, and pain was well controlled with oral pain medications. ? ? ?Consults: None ? ?Significant Diagnostic Studies: radiology: X-Ray: intraoperative ? ? ?Treatments: surgery: ?1. Arthrodesis C6-7, anterior interbody technique  ?2. Placement of intervertebral biomechanical device, K2 M [place] titanium 8 x 13 x 16 mm ?3. Placement of anterior instrumentation consisting of interbody plate and screws -[place] 22 mm; 4.0 x 16 mm screws bilaterally at C6 and C7 ?4. Discectomy at C6-7 for decompression of spinal cord and exiting nerve roots  ?5. Use of morselized bone allograft  ?6. Use of autograft, same incision ?7. Use of intraoperative microscope ? ?Discharge Exam: ?Blood pressure 118/67, pulse 99, temperature 98.5 ?F (36.9 ?C), temperature source Oral, resp. rate 18, height 5\' 10"  (1.778 m), weight 113.4 kg, SpO2 96 %. ?Physical Exam: Patient is awake,  A/O X 4, conversant, and in good spirits. Eyes open spontaneously. They are in NAD and VSS. Doing well. Speech is fluent and appropriate. MAEW with good strength that is symmetric bilaterally.  BUE 5/5 throughout except left triceps 4+/5, BLE 5/5 throughout. Sensation to light touch is intact. PERLA, EOMI. CNs grossly intact. Dressing is clean dry intact. Incision is well approximated with no drainage, erythema, or edema. Hard cervical collar in place ? ? ? ? ? ?Disposition: Discharge disposition: 01-Home or Self Care ? ? ? ? ? ? ?Discharge Instructions   ? ? Incentive spirometry RT   Complete by: As directed ?  ? ?  ? ?Allergies as of 05/05/2021   ? ?   Reactions  ? Codeine Hives  ? Cymbalta [duloxetine Hcl] Other (See Comments)  ? "Felt strange"  ? Flexeril [cyclobenzaprine] Other (See Comments)  ? sedation  ? Robaxin [methocarbamol] Nausea Only  ? Zanaflex [tizanidine Hcl] Nausea Only  ? ?  ? ?  ?Medication List  ?  ? ?TAKE these medications   ? ?amitriptyline 50 MG tablet ?Commonly known as: ELAVIL ?Take 50 mg by mouth at bedtime. ?  ?atenolol 25 MG tablet ?Commonly known as: TENORMIN ?Take 25 mg by mouth daily. ?  ?atorvastatin 10 MG tablet ?Commonly known as: LIPITOR ?Take 10 mg by mouth daily. ?  ?diclofenac 75 MG EC tablet ?Commonly known as: VOLTAREN ?Take 75 mg by mouth 2 (two) times daily. ?  ?fluticasone 50 MCG/ACT nasal spray ?Commonly known as: FLONASE ?Place 2 sprays into both nostrils daily as needed for allergies or rhinitis. ?  ?gabapentin 300 MG capsule ?Commonly known as: NEURONTIN ?Take 300 mg by mouth 3 (three) times daily. ?  ?loratadine 10 MG tablet ?Commonly  known as: CLARITIN ?Take 10 mg by mouth daily. ?  ?losartan 100 MG tablet ?Commonly known as: COZAAR ?Take 100 mg daily by mouth. ?  ?tapentadol 50 MG tablet ?Commonly known as: Nucynta ?Take 1 tablet (50 mg total) by mouth every 4 (four) hours as needed for severe pain. ?  ?tiZANidine 4 MG tablet ?Commonly known as: ZANAFLEX ?Take 4 mg by  mouth 3 (three) times daily. ?  ? ?  ? ? ? ?Signed: ?Council Mechanic, DNP, NP-C ?05/05/2021, 9:03 AM ? ? ?

## 2021-11-12 ENCOUNTER — Other Ambulatory Visit: Payer: Self-pay | Admitting: Neurological Surgery

## 2021-11-15 ENCOUNTER — Other Ambulatory Visit: Payer: Self-pay | Admitting: Neurological Surgery

## 2021-12-02 NOTE — Pre-Procedure Instructions (Signed)
Surgical Instructions    Your procedure is scheduled on Friday, December 10, 2021 at 12:15 PM.  Report to [facility] Main Entrance "A" at 10:15 A.M., then check in with the Admitting office.  Call this number if you have problems the morning of surgery:  [phone]   If you have any questions prior to your surgery date call [phone]: Open Monday-Friday 8am-4pm  *If you experience any cold or flu symptoms such as cough, fever, chills, shortness of breath, etc. between now and your scheduled surgery, please notify us.*    Remember:  Do not eat or drink after midnight the night before your surgery    Take these medicines the morning of surgery with A SIP OF WATER:  atenolol (TENORMIN)  atorvastatin (LIPITOR)  gabapentin (NEURONTIN)  loratadine (CLARITIN)   IF NEEDED: fluticasone (FLONASE)  tapentadol (NUCYNTA)  tiZANidine (ZANAFLEX)   As of today, STOP taking any Aspirin (unless otherwise instructed by your surgeon) Aleve, Naproxen, Ibuprofen, Motrin, Advil, Goody's, BC's, all herbal medications, fish oil, and all vitamins. This includes your diclofenac (VOLTAREN).                     Do NOT Smoke (Tobacco/Vaping) for 24 hours prior to your procedure.  If you use a CPAP at night, you may bring your mask/headgear for your overnight stay.   Contacts, glasses, piercing's, hearing aid's, dentures or partials may not be worn into surgery, please bring cases for these belongings.    For patients admitted to the hospital, discharge time will be determined by your treatment team.   Patients discharged the day of surgery will not be allowed to drive home, and someone needs to stay with them for 24 hours.  SURGICAL WAITING ROOM VISITATION Patients having surgery or a procedure may have two support people in the waiting area. Visitors may stay in the waiting area during the procedure and switch out with other visitors if needed. Children under the age of 35 must have an adult  accompany them who is not the patient. If the patient needs to stay at the hospital during part of their recovery, the visitor guidelines for inpatient rooms apply.  Please refer to the [facility] website for the visitor guidelines for Inpatients (after your surgery is over and you are in a regular room).    Special instructions:   [facility]- Preparing For Surgery  Before surgery, you can play an important role. Because skin is not sterile, your skin needs to be as free of germs as possible. You can reduce the number of germs on your skin by washing with CHG (chlorahexidine gluconate) Soap before surgery.  CHG is an antiseptic cleaner which kills germs and bonds with the skin to continue killing germs even after washing.    Oral Hygiene is also important to reduce your risk of infection.  Remember - BRUSH YOUR TEETH THE MORNING OF SURGERY WITH YOUR REGULAR TOOTHPASTE  Please do not use if you have an allergy to CHG or antibacterial soaps. If your skin becomes reddened/irritated stop using the CHG.  Do not shave (including legs and underarms) for at least 48 hours prior to first CHG shower. It is OK to shave your face.  Please follow these instructions carefully.   Shower the NIGHT BEFORE SURGERY and the MORNING OF SURGERY  If you chose to wash your hair, wash your hair first as usual with your normal shampoo.  After you shampoo, rinse your hair and body thoroughly  to remove the shampoo.  Use CHG Soap as you would any other liquid soap. You can apply CHG directly to the skin and wash gently with a scrungie or a clean washcloth.   Apply the CHG Soap to your body ONLY FROM THE NECK DOWN.  Do not use on open wounds or open sores. Avoid contact with your eyes, ears, mouth and genitals (private parts). Wash Face and genitals (private parts)  with your normal soap.   Wash thoroughly, paying special attention to the area where your surgery will be performed.  Thoroughly rinse your body with  warm water from the neck down.  DO NOT shower/wash with your normal soap after using and rinsing off the CHG Soap.  Pat yourself dry with a CLEAN TOWEL.  Wear CLEAN PAJAMAS to bed the night before surgery  Place CLEAN SHEETS on your bed the night before your surgery  DO NOT SLEEP WITH PETS.   Day of Surgery: Take a shower with CHG soap. Do not wear jewelry. Do not wear lotions, powders, perfumes/colognes, or deodorant. Do not shave 48 hours prior to surgery.  Men may shave face and neck. Wear Clean/Comfortable clothing the morning of surgery Do not bring valuables to the hospital.  [facility] is not responsible for any belongings or valuables. Remember to brush your teeth WITH YOUR REGULAR TOOTHPASTE.   Please read over the following fact sheets that you were given.  If you received a COVID test during your pre-op visit  it is requested that you wear a mask when out in public, stay away from anyone that may not be feeling well and notify your surgeon if you develop symptoms. If you have been in contact with anyone that has tested positive in the last 10 days please notify you surgeon.

## 2021-12-03 ENCOUNTER — Encounter (HOSPITAL_COMMUNITY): Payer: Self-pay

## 2021-12-03 ENCOUNTER — Encounter (HOSPITAL_COMMUNITY)
Admission: RE | Admit: 2021-12-03 | Discharge: 2021-12-03 | Disposition: A | Discharge status: Home / Self Care | Payer: Medicare HMO | Source: Ambulatory Visit | Attending: Neurological Surgery | Admitting: Neurological Surgery

## 2021-12-03 ENCOUNTER — Other Ambulatory Visit: Payer: Self-pay

## 2021-12-03 VITALS — BP 131/88 | HR 63 | Temp 97.8°F | Resp 17 | Ht 70.0 in | Wt 246.8 lb

## 2021-12-03 DIAGNOSIS — I2510 Atherosclerotic heart disease of native coronary artery without angina pectoris: Secondary | ICD-10-CM | POA: Diagnosis not present

## 2021-12-03 DIAGNOSIS — Z01812 Encounter for preprocedural laboratory examination: Secondary | ICD-10-CM | POA: Diagnosis present

## 2021-12-03 DIAGNOSIS — Z01818 Encounter for other preprocedural examination: Secondary | ICD-10-CM

## 2021-12-03 LAB — CBC
HCT: 43.7 % (ref 39.0–52.0)
Hemoglobin: 15.1 g/dL (ref 13.0–17.0)
MCH: 30.6 pg (ref 26.0–34.0)
MCHC: 34.6 g/dL (ref 30.0–36.0)
MCV: 88.5 fL (ref 80.0–100.0)
Platelets: 224 10*3/uL (ref 150–400)
RBC: 4.94 MIL/uL (ref 4.22–5.81)
RDW: 12.8 % (ref 11.5–15.5)
WBC: 5 10*3/uL (ref 4.0–10.5)
nRBC: 0 % (ref 0.0–0.2)

## 2021-12-03 LAB — BASIC METABOLIC PANEL
Anion gap: 7 (ref 5–15)
BUN: 18 mg/dL (ref 6–20)
CO2: 27 mmol/L (ref 22–32)
Calcium: 10.2 mg/dL (ref 8.9–10.3)
Chloride: 107 mmol/L (ref 98–111)
Creatinine, Ser: 1.28 mg/dL — ABNORMAL HIGH (ref 0.61–1.24)
GFR, Estimated: >60 mL/min (ref >60)
Glucose, Bld: 114 mg/dL — ABNORMAL HIGH (ref 70–99)
Potassium: 4.7 mmol/L (ref 3.5–5.1)
Sodium: 141 mmol/L (ref 135–145)

## 2021-12-03 LAB — SURGICAL PCR SCREEN
MRSA, PCR: NEGATIVE
Staphylococcus aureus: NEGATIVE

## 2021-12-03 LAB — TYPE AND SCREEN
ABO/RH(D): B POS
Antibody Screen: NEGATIVE

## 2021-12-03 NOTE — Progress Notes (Signed)
PCP - Dr. Sterling Big Cardiologist - Denies  PPM/ICD - Denies  Chest x-ray - N/A EKG - 05/04/21 Stress Test - Denies ECHO - Denies Cardiac Cath - Denies  Sleep Study - Denies  Diabetes: Denies  Blood Thinner Instructions: N/A Aspirin Instructions: N/A  ERAS Protcol - No  COVID TEST- N/A   Anesthesia review: No  Patient denies shortness of breath, fever, cough and chest pain at PAT appointment   All instructions explained to the patient, with a verbal understanding of the material. Patient agrees to go over the instructions while at home for a better understanding. Patient also instructed to self quarantine after being tested for COVID-19. The opportunity to ask questions was provided.

## 2021-12-10 ENCOUNTER — Encounter (HOSPITAL_COMMUNITY): Payer: Self-pay | Admitting: Neurological Surgery

## 2021-12-10 ENCOUNTER — Ambulatory Visit (HOSPITAL_BASED_OUTPATIENT_CLINIC_OR_DEPARTMENT_OTHER): Payer: PRIVATE HEALTH INSURANCE | Admitting: Anesthesiology

## 2021-12-10 ENCOUNTER — Ambulatory Visit (HOSPITAL_COMMUNITY): Payer: PRIVATE HEALTH INSURANCE | Admitting: Anesthesiology

## 2021-12-10 ENCOUNTER — Ambulatory Visit (HOSPITAL_COMMUNITY): Payer: Medicare HMO

## 2021-12-10 ENCOUNTER — Encounter (HOSPITAL_COMMUNITY)
Admission: RE | Disposition: A | Discharge status: Home / Self Care | Payer: Self-pay | Source: Home / Self Care | Attending: Neurological Surgery

## 2021-12-10 ENCOUNTER — Other Ambulatory Visit: Payer: Self-pay

## 2021-12-10 ENCOUNTER — Observation Stay (HOSPITAL_COMMUNITY)
Admission: RE | Admit: 2021-12-10 | Discharge: 2021-12-11 | Disposition: A | Discharge status: Home / Self Care | Payer: PRIVATE HEALTH INSURANCE | Attending: Neurological Surgery | Admitting: Neurological Surgery

## 2021-12-10 DIAGNOSIS — Z87891 Personal history of nicotine dependence: Secondary | ICD-10-CM | POA: Insufficient documentation

## 2021-12-10 DIAGNOSIS — M1990 Unspecified osteoarthritis, unspecified site: Secondary | ICD-10-CM | POA: Diagnosis not present

## 2021-12-10 DIAGNOSIS — M5416 Radiculopathy, lumbar region: Secondary | ICD-10-CM | POA: Diagnosis present

## 2021-12-10 DIAGNOSIS — M48061 Spinal stenosis, lumbar region without neurogenic claudication: Secondary | ICD-10-CM | POA: Insufficient documentation

## 2021-12-10 DIAGNOSIS — I10 Essential (primary) hypertension: Secondary | ICD-10-CM

## 2021-12-10 DIAGNOSIS — Z79899 Other long term (current) drug therapy: Secondary | ICD-10-CM | POA: Insufficient documentation

## 2021-12-10 DIAGNOSIS — M4726 Other spondylosis with radiculopathy, lumbar region: Principal | ICD-10-CM | POA: Insufficient documentation

## 2021-12-10 HISTORY — PX: SPINAL CORD STIMULATOR REMOVAL: SHX5379

## 2021-12-10 HISTORY — PX: TRANSFORAMINAL LUMBAR INTERBODY FUSION W/ MIS 1 LEVEL: SHX6145

## 2021-12-10 SURGERY — MINIMALLY INVASIVE (MIS) TRANSFORAMINAL LUMBAR INTERBODY FUSION (TLIF) 1 LEVEL
Anesthesia: General | Laterality: Right

## 2021-12-10 MED ORDER — MIDAZOLAM HCL 5 MG/5ML IJ SOLN
INTRAMUSCULAR | Status: DC | PRN
  Administered 2021-12-10: 2 mg via INTRAVENOUS

## 2021-12-10 MED ORDER — THROMBIN 5000 UNITS EX SOLR
CUTANEOUS | Status: AC
  Filled 2021-12-10: qty 5000

## 2021-12-10 MED ORDER — ATORVASTATIN CALCIUM 10 MG PO TABS
10.0000 mg | ORAL_TABLET | Freq: Every day | ORAL | Status: DC
  Administered 2021-12-11: 10 mg via ORAL
  Filled 2021-12-10: qty 1

## 2021-12-10 MED ORDER — PHENOL 1.4 % MT LIQD: 1.0000 | OROMUCOSAL | Status: DC | PRN

## 2021-12-10 MED ORDER — ORAL CARE MOUTH RINSE: 15.0000 mL | Freq: Once | OROMUCOSAL | Status: AC

## 2021-12-10 MED ORDER — CHLORHEXIDINE GLUCONATE 0.12 % MT SOLN
15.0000 mL | Freq: Once | OROMUCOSAL | Status: AC
  Administered 2021-12-10: 15 mL via OROMUCOSAL
  Filled 2021-12-10: qty 15

## 2021-12-10 MED ORDER — THROMBIN 20000 UNITS EX KIT
PACK | CUTANEOUS | Status: AC
  Filled 2021-12-10: qty 1

## 2021-12-10 MED ORDER — PROPOFOL 10 MG/ML IV BOLUS
INTRAVENOUS | Status: AC
  Filled 2021-12-10: qty 20

## 2021-12-10 MED ORDER — ACETAMINOPHEN 10 MG/ML IV SOLN
INTRAVENOUS | Status: AC
  Filled 2021-12-10: qty 100

## 2021-12-10 MED ORDER — ROCURONIUM BROMIDE 10 MG/ML (PF) SYRINGE
PREFILLED_SYRINGE | INTRAVENOUS | Status: DC | PRN
  Administered 2021-12-10: 70 mg via INTRAVENOUS
  Administered 2021-12-10: 30 mg via INTRAVENOUS

## 2021-12-10 MED ORDER — KETOROLAC TROMETHAMINE 0.5 % OP SOLN
1.0000 [drp] | Freq: Four times a day (QID) | OPHTHALMIC | Status: DC | PRN
  Administered 2021-12-10: 1 [drp] via OPHTHALMIC
  Filled 2021-12-10: qty 5

## 2021-12-10 MED ORDER — PROPOFOL 10 MG/ML IV BOLUS
INTRAVENOUS | Status: DC | PRN
  Administered 2021-12-10: 50 mg via INTRAVENOUS
  Administered 2021-12-10: 150 mg via INTRAVENOUS

## 2021-12-10 MED ORDER — ONDANSETRON HCL 4 MG PO TABS: 4.0000 mg | ORAL_TABLET | Freq: Four times a day (QID) | ORAL | Status: DC | PRN

## 2021-12-10 MED ORDER — ONDANSETRON HCL 4 MG/2ML IJ SOLN
INTRAMUSCULAR | Status: AC
  Filled 2021-12-10: qty 2

## 2021-12-10 MED ORDER — TIZANIDINE HCL 4 MG PO TABS
4.0000 mg | ORAL_TABLET | Freq: Three times a day (TID) | ORAL | Status: DC | PRN
  Administered 2021-12-10 – 2021-12-11 (×3): 4 mg via ORAL
  Filled 2021-12-10 (×2): qty 1
  Filled 2021-12-10 (×2): qty 2

## 2021-12-10 MED ORDER — SODIUM CHLORIDE 0.9% FLUSH: 3.0000 mL | INTRAVENOUS | Status: DC | PRN

## 2021-12-10 MED ORDER — FENTANYL CITRATE (PF) 100 MCG/2ML IJ SOLN
INTRAMUSCULAR | Status: AC
  Filled 2021-12-10: qty 2

## 2021-12-10 MED ORDER — LIDOCAINE 2% (20 MG/ML) 5 ML SYRINGE
INTRAMUSCULAR | Status: AC
  Filled 2021-12-10: qty 5

## 2021-12-10 MED ORDER — SODIUM CHLORIDE 0.9% FLUSH: 3.0000 mL | Freq: Two times a day (BID) | INTRAVENOUS | Status: DC

## 2021-12-10 MED ORDER — ROCURONIUM BROMIDE 10 MG/ML (PF) SYRINGE
PREFILLED_SYRINGE | INTRAVENOUS | Status: AC
  Filled 2021-12-10: qty 10

## 2021-12-10 MED ORDER — ONDANSETRON HCL 4 MG/2ML IJ SOLN: 4.0000 mg | Freq: Four times a day (QID) | INTRAMUSCULAR | Status: DC | PRN

## 2021-12-10 MED ORDER — EPHEDRINE SULFATE-NACL 50-0.9 MG/10ML-% IV SOSY
PREFILLED_SYRINGE | INTRAVENOUS | Status: DC | PRN
  Administered 2021-12-10 (×2): 10 mg via INTRAVENOUS

## 2021-12-10 MED ORDER — GABAPENTIN 300 MG PO CAPS
300.0000 mg | ORAL_CAPSULE | Freq: Three times a day (TID) | ORAL | Status: DC
  Administered 2021-12-10 – 2021-12-11 (×2): 300 mg via ORAL
  Filled 2021-12-10 (×2): qty 1

## 2021-12-10 MED ORDER — CEFAZOLIN SODIUM-DEXTROSE 2-4 GM/100ML-% IV SOLN
2.0000 g | INTRAVENOUS | Status: AC
  Administered 2021-12-10 (×2): 2 g via INTRAVENOUS
  Filled 2021-12-10: qty 100

## 2021-12-10 MED ORDER — ACETAMINOPHEN 325 MG PO TABS: 650.0000 mg | ORAL_TABLET | ORAL | Status: DC | PRN

## 2021-12-10 MED ORDER — SUGAMMADEX SODIUM 200 MG/2ML IV SOLN
INTRAVENOUS | Status: DC | PRN
  Administered 2021-12-10: 200 mg via INTRAVENOUS

## 2021-12-10 MED ORDER — PHENYLEPHRINE HCL-NACL 20-0.9 MG/250ML-% IV SOLN
INTRAVENOUS | Status: DC | PRN
  Administered 2021-12-10: 40 ug/min via INTRAVENOUS

## 2021-12-10 MED ORDER — FENTANYL CITRATE (PF) 100 MCG/2ML IJ SOLN
25.0000 ug | INTRAMUSCULAR | Status: DC | PRN
  Administered 2021-12-10 (×4): 25 ug via INTRAVENOUS
  Administered 2021-12-10: 50 ug via INTRAVENOUS

## 2021-12-10 MED ORDER — BUPIVACAINE LIPOSOME 1.3 % IJ SUSP
INTRAMUSCULAR | Status: AC
  Filled 2021-12-10: qty 20

## 2021-12-10 MED ORDER — FENTANYL CITRATE (PF) 250 MCG/5ML IJ SOLN
INTRAMUSCULAR | Status: AC
  Filled 2021-12-10: qty 5

## 2021-12-10 MED ORDER — DEXAMETHASONE SODIUM PHOSPHATE 10 MG/ML IJ SOLN
INTRAMUSCULAR | Status: AC
  Filled 2021-12-10: qty 1

## 2021-12-10 MED ORDER — FENTANYL CITRATE (PF) 250 MCG/5ML IJ SOLN
INTRAMUSCULAR | Status: DC | PRN
  Administered 2021-12-10 (×5): 50 ug via INTRAVENOUS
  Administered 2021-12-10: 100 ug via INTRAVENOUS

## 2021-12-10 MED ORDER — THROMBIN 5000 UNITS EX SOLR
OROMUCOSAL | Status: DC | PRN
  Administered 2021-12-10: 5 mL via TOPICAL

## 2021-12-10 MED ORDER — LACTATED RINGERS IV SOLN: INTRAVENOUS | Status: DC | PRN

## 2021-12-10 MED ORDER — DOCUSATE SODIUM 100 MG PO CAPS
100.0000 mg | ORAL_CAPSULE | Freq: Two times a day (BID) | ORAL | Status: DC
  Administered 2021-12-10: 100 mg via ORAL
  Filled 2021-12-10 (×2): qty 1

## 2021-12-10 MED ORDER — SODIUM CHLORIDE 0.9 % IV SOLN
250.0000 mL | INTRAVENOUS | Status: DC
  Administered 2021-12-10: 250 mL via INTRAVENOUS

## 2021-12-10 MED ORDER — LIDOCAINE 2% (20 MG/ML) 5 ML SYRINGE
INTRAMUSCULAR | Status: DC | PRN
  Administered 2021-12-10: 80 mg via INTRAVENOUS

## 2021-12-10 MED ORDER — BUPIVACAINE LIPOSOME 1.3 % IJ SUSP
INTRAMUSCULAR | Status: DC | PRN
  Administered 2021-12-10: 20 mL

## 2021-12-10 MED ORDER — BUPIVACAINE HCL (PF) 0.5 % IJ SOLN
INTRAMUSCULAR | Status: AC
  Filled 2021-12-10: qty 30

## 2021-12-10 MED ORDER — EPHEDRINE 5 MG/ML INJ
INTRAVENOUS | Status: AC
  Filled 2021-12-10: qty 5

## 2021-12-10 MED ORDER — CHLORHEXIDINE GLUCONATE CLOTH 2 % EX PADS: 6.0000 | MEDICATED_PAD | Freq: Once | CUTANEOUS | Status: DC

## 2021-12-10 MED ORDER — ACETAMINOPHEN 10 MG/ML IV SOLN
INTRAVENOUS | Status: DC | PRN
  Administered 2021-12-10: 1000 mg via INTRAVENOUS

## 2021-12-10 MED ORDER — MENTHOL 3 MG MT LOZG
1.0000 | LOZENGE | OROMUCOSAL | Status: DC | PRN
  Filled 2021-12-10: qty 9

## 2021-12-10 MED ORDER — LIDOCAINE-EPINEPHRINE 1 %-1:100000 IJ SOLN
INTRAMUSCULAR | Status: DC | PRN
  Administered 2021-12-10: 20 mL via SURGICAL_CAVITY

## 2021-12-10 MED ORDER — ALBUMIN HUMAN 5 % IV SOLN: INTRAVENOUS | Status: DC | PRN

## 2021-12-10 MED ORDER — ACETAMINOPHEN 650 MG RE SUPP: 650.0000 mg | RECTAL | Status: DC | PRN

## 2021-12-10 MED ORDER — CEFAZOLIN SODIUM 1 G IJ SOLR
INTRAMUSCULAR | Status: AC
  Filled 2021-12-10: qty 20

## 2021-12-10 MED ORDER — CEFAZOLIN SODIUM-DEXTROSE 2-4 GM/100ML-% IV SOLN
2.0000 g | Freq: Three times a day (TID) | INTRAVENOUS | Status: AC
  Administered 2021-12-10 – 2021-12-11 (×2): 2 g via INTRAVENOUS
  Filled 2021-12-10 (×2): qty 100

## 2021-12-10 MED ORDER — SODIUM CHLORIDE (PF) 0.9 % IJ SOLN
INTRAMUSCULAR | Status: AC
  Filled 2021-12-10: qty 10

## 2021-12-10 MED ORDER — VANCOMYCIN HCL 500 MG IV SOLR
INTRAVENOUS | Status: AC
  Filled 2021-12-10: qty 10

## 2021-12-10 MED ORDER — FENTANYL CITRATE PF 50 MCG/ML IJ SOSY
25.0000 ug | PREFILLED_SYRINGE | INTRAMUSCULAR | Status: DC | PRN
  Administered 2021-12-10 – 2021-12-11 (×4): 25 ug via INTRAVENOUS
  Filled 2021-12-10 (×4): qty 1

## 2021-12-10 MED ORDER — LACTATED RINGERS IV SOLN: INTRAVENOUS | Status: DC

## 2021-12-10 MED ORDER — 0.9 % SODIUM CHLORIDE (POUR BTL) OPTIME
TOPICAL | Status: DC | PRN
  Administered 2021-12-10: 1000 mL

## 2021-12-10 MED ORDER — LOSARTAN POTASSIUM 50 MG PO TABS
100.0000 mg | ORAL_TABLET | Freq: Every day | ORAL | Status: DC
  Administered 2021-12-11: 100 mg via ORAL
  Filled 2021-12-10: qty 2

## 2021-12-10 MED ORDER — MIDAZOLAM HCL 2 MG/2ML IJ SOLN
INTRAMUSCULAR | Status: AC
  Filled 2021-12-10: qty 2

## 2021-12-10 MED ORDER — DEXAMETHASONE SODIUM PHOSPHATE 10 MG/ML IJ SOLN
INTRAMUSCULAR | Status: DC | PRN
  Administered 2021-12-10: 10 mg via INTRAVENOUS

## 2021-12-10 MED ORDER — ONDANSETRON HCL 4 MG/2ML IJ SOLN
INTRAMUSCULAR | Status: DC | PRN
  Administered 2021-12-10: 4 mg via INTRAVENOUS

## 2021-12-10 MED ORDER — LIDOCAINE-EPINEPHRINE 1 %-1:100000 IJ SOLN
INTRAMUSCULAR | Status: AC
  Filled 2021-12-10: qty 1

## 2021-12-10 MED ORDER — ATENOLOL 25 MG PO TABS
25.0000 mg | ORAL_TABLET | Freq: Every day | ORAL | Status: DC
  Administered 2021-12-11: 25 mg via ORAL
  Filled 2021-12-10: qty 1

## 2021-12-10 MED ORDER — TAPENTADOL HCL 50 MG PO TABS
100.0000 mg | ORAL_TABLET | ORAL | Status: DC | PRN
  Administered 2021-12-10 – 2021-12-11 (×5): 100 mg via ORAL
  Filled 2021-12-10 (×5): qty 2

## 2021-12-10 MED ORDER — VANCOMYCIN HCL 500 MG IV SOLR
INTRAVENOUS | Status: DC | PRN
  Administered 2021-12-10: 500 mg via TOPICAL

## 2021-12-10 SURGICAL SUPPLY — 76 items
BAG COUNTER SPONGE SURGICOUNT (BAG) ×2 IMPLANT
BAG SPNG CNTER NS LX DISP (BAG) ×2
BAND INSRT 18 STRL LF DISP RB (MISCELLANEOUS) ×4
BAND RUBBER #18 3X1/16 STRL (MISCELLANEOUS) ×4 IMPLANT
BASKET BONE COLLECTION (BASKET) ×2 IMPLANT
BUR MATCHSTICK NEURO 3.0 LAGG (BURR) ×2 IMPLANT
CAGE MOD EX PL 9X9X28 22D (Cage) IMPLANT
CNTNR URN SCR LID CUP LEK RST (MISCELLANEOUS) ×2 IMPLANT
CONT SPEC 4OZ STRL OR WHT (MISCELLANEOUS) ×2
COVER BACK TABLE 60X90IN (DRAPES) ×2 IMPLANT
COVER MAYO STAND STRL (DRAPES) ×2 IMPLANT
DRAIN JACKSON RD 7FR 3/32 (WOUND CARE) IMPLANT
DRAPE C-ARM 42X72 X-RAY (DRAPES) ×4 IMPLANT
DRAPE C-ARMOR (DRAPES) IMPLANT
DRAPE LAPAROTOMY 100X72X124 (DRAPES) ×2 IMPLANT
DRAPE MICROSCOPE SLANT 54X150 (MISCELLANEOUS) ×2 IMPLANT
DRAPE STERI IOBAN 125X83 (DRAPES) IMPLANT
DRAPE SURG 17X23 STRL (DRAPES) ×2 IMPLANT
DRSG OPSITE 4X5.5 SM (GAUZE/BANDAGES/DRESSINGS) IMPLANT
DRSG OPSITE POSTOP 3X4 (GAUZE/BANDAGES/DRESSINGS) IMPLANT
DURAPREP 26ML APPLICATOR (WOUND CARE) ×2 IMPLANT
ELECT BLADE INSULATED 6.5IN (ELECTROSURGICAL) ×2
ELECT REM PT RETURN 9FT ADLT (ELECTROSURGICAL) ×2
ELECTRODE BLDE INSULATED 6.5IN (ELECTROSURGICAL) ×2 IMPLANT
ELECTRODE REM PT RTRN 9FT ADLT (ELECTROSURGICAL) ×2 IMPLANT
GAUZE 4X4 16PLY ~~LOC~~+RFID DBL (SPONGE) IMPLANT
GAUZE SPONGE 4X4 12PLY STRL (GAUZE/BANDAGES/DRESSINGS) ×2 IMPLANT
GEL DBM PROPEL 5ML (Putty) IMPLANT
GLOVE BIOGEL PI IND STRL 8 (GLOVE) ×4 IMPLANT
GLOVE ECLIPSE 8.0 STRL XLNG CF (GLOVE) ×4 IMPLANT
GLOVE SURG ENC MOIS LTX SZ8 (GLOVE) ×2 IMPLANT
GLOVE SURG UNDER POLY LF SZ8.5 (GLOVE) ×2 IMPLANT
GOWN STRL REUS W/ TWL LRG LVL3 (GOWN DISPOSABLE) IMPLANT
GOWN STRL REUS W/ TWL XL LVL3 (GOWN DISPOSABLE) ×4 IMPLANT
GOWN STRL REUS W/TWL 2XL LVL3 (GOWN DISPOSABLE) IMPLANT
GOWN STRL REUS W/TWL LRG LVL3 (GOWN DISPOSABLE)
GOWN STRL REUS W/TWL XL LVL3 (GOWN DISPOSABLE) ×4
GUIDEWIRE NITINOL BEVEL TIP (WIRE) IMPLANT
HEMOSTAT POWDER KIT SURGIFOAM (HEMOSTASIS) ×2 IMPLANT
HEMOSTAT POWDER SURGIFOAM 1G (HEMOSTASIS) IMPLANT
KIT BASIN OR (CUSTOM PROCEDURE TRAY) ×2 IMPLANT
KIT INFUSE XX SMALL 0.7CC (Orthopedic Implant) IMPLANT
KIT POSITION SURG JACKSON T1 (MISCELLANEOUS) ×2 IMPLANT
KIT TURNOVER KIT B (KITS) ×2 IMPLANT
MARKER SKIN DUAL TIP RULER LAB (MISCELLANEOUS) ×2 IMPLANT
NDL BIOPSY DD SERENGETI 8G (NEEDLE) IMPLANT
NDL HYPO 21X1.5 SAFETY (NEEDLE) ×2 IMPLANT
NDL HYPO 25X1 1.5 SAFETY (NEEDLE) ×2 IMPLANT
NDL SPNL 22GX3.5 QUINCKE BK (NEEDLE) ×2 IMPLANT
NEEDLE BIOPSY DD SERENGETI 8G (NEEDLE) ×4 IMPLANT
NEEDLE HYPO 21X1.5 SAFETY (NEEDLE) ×2 IMPLANT
NEEDLE HYPO 25X1 1.5 SAFETY (NEEDLE) ×2 IMPLANT
NEEDLE SPNL 22GX3.5 QUINCKE BK (NEEDLE) ×2 IMPLANT
NS IRRIG 1000ML POUR BTL (IV SOLUTION) ×2 IMPLANT
PACK LAMINECTOMY NEURO (CUSTOM PROCEDURE TRAY) ×2 IMPLANT
PAD ARMBOARD 7.5X6 YLW CONV (MISCELLANEOUS) ×6 IMPLANT
PATTIES SURGICAL .5 X.5 (GAUZE/BANDAGES/DRESSINGS) IMPLANT
PATTIES SURGICAL .5 X1 (DISPOSABLE) IMPLANT
PATTIES SURGICAL 1X1 (DISPOSABLE) IMPLANT
ROD SPINAL 5.5X80 TI LORDOSE (Rod) IMPLANT
SCREW LOCK RELINE 5.5 TULIP (Screw) IMPLANT
SCREW RELINE MAS RED 7.5X50MM (Screw) IMPLANT
SCREW RELINE RED 6.5X50MM POLY (Screw) IMPLANT
SPIKE FLUID TRANSFER (MISCELLANEOUS) ×2 IMPLANT
SPONGE SURGIFOAM ABS GEL SZ50 (HEMOSTASIS) ×2 IMPLANT
SPONGE T-LAP 4X18 ~~LOC~~+RFID (SPONGE) IMPLANT
STAPLER VISISTAT 35W (STAPLE) IMPLANT
SUT VIC AB 0 CT1 18XCR BRD8 (SUTURE) ×2 IMPLANT
SUT VIC AB 0 CT1 8-18 (SUTURE) ×2
SUT VIC AB 2-0 CP2 18 (SUTURE) ×2 IMPLANT
SUT VIC AB 3-0 SH 8-18 (SUTURE) ×2 IMPLANT
SYR 20CC LL (SYRINGE) ×2 IMPLANT
TOWEL GREEN STERILE (TOWEL DISPOSABLE) ×2 IMPLANT
TOWEL GREEN STERILE FF (TOWEL DISPOSABLE) ×2 IMPLANT
TRAY FOLEY MTR SLVR 16FR STAT (SET/KITS/TRAYS/PACK) ×2 IMPLANT
WATER STERILE IRR 1000ML POUR (IV SOLUTION) ×2 IMPLANT

## 2021-12-10 NOTE — Anesthesia Postprocedure Evaluation (Signed)
Anesthesia Post Note  Patient: Eric Casey  Procedure(s) Performed: MIS DECOMP,TLIF L34,EXPLORE FUSIONL45 (Right) LUMBAR SPINAL CORD STIMULATOR REMOVAL     Patient location during evaluation: PACU Anesthesia Type: General Level of consciousness: awake and alert Pain management: pain level controlled Vital Signs Assessment: post-procedure vital signs reviewed and stable Respiratory status: spontaneous breathing, nonlabored ventilation, respiratory function stable and patient connected to nasal cannula oxygen Cardiovascular status: blood pressure returned to baseline and stable Postop Assessment: no apparent nausea or vomiting Anesthetic complications: no   No notable events documented.  Last Vitals:  Vitals:   12/10/21 1709 12/10/21 1729  BP:  108/68  Pulse:  88  Resp:  20  Temp: 36.4 C 36.6 C  SpO2:  98%    Last Pain:  Vitals:   12/10/21 1729  TempSrc: Oral  PainSc:                  Nelle Don Vivianne Carles

## 2021-12-10 NOTE — Anesthesia Preprocedure Evaluation (Addendum)
Anesthesia Evaluation  Patient identified by MRN, date of birth, ID band Patient awake    Reviewed: Allergy & Precautions, NPO status , Patient's Chart, lab work & pertinent test results  History of Anesthesia Complications (+) PONV and history of anesthetic complications  Airway Mallampati: II       Dental no notable dental hx.    Pulmonary former smoker   breath sounds clear to auscultation       Cardiovascular hypertension,  Rhythm:Regular Rate:Normal     Neuro/Psych  Neuromuscular disease    GI/Hepatic negative GI ROS, Neg liver ROS,,,  Endo/Other  negative endocrine ROS    Renal/GU negative Renal ROS     Musculoskeletal  (+) Arthritis ,    Abdominal   Peds  Hematology   Anesthesia Other Findings   Reproductive/Obstetrics                             Anesthesia Physical Anesthesia Plan  ASA: 3  Anesthesia Plan:    Post-op Pain Management: Tylenol PO (pre-op)*   Induction:   PONV Risk Score and Plan: 3 and Ondansetron, Dexamethasone and Midazolam  Airway Management Planned: Oral ETT  Additional Equipment:   Intra-op Plan:   Post-operative Plan: Extubation in OR  Informed Consent: I have reviewed the patients History and Physical, chart, labs and discussed the procedure including the risks, benefits and alternatives for the proposed anesthesia with the patient or authorized representative who has indicated his/her understanding and acceptance.     Dental advisory given  Plan Discussed with: CRNA, Anesthesiologist and Surgeon  Anesthesia Plan Comments:        Anesthesia Quick Evaluation

## 2021-12-10 NOTE — Op Note (Signed)
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   POSTOP DIAGNOSIS: Same   PROCEDURE: Minimally invasive L 3-4 decompression and transforaminal lumbar interbody fusion and arthrodesis, posterolateral arthrodesis, right sided approach Bilateral L3 pedicle screw instrumentation, NuVasive 7.5 x 50 mm bilaterally Exploration of fusion, L4-5, with removal and revision of L4 bilateral, L5 bilateral pedicle screws, NuVasive 6.5 x 50 mm bilaterally at L4 and L5 Placement of anterior biomechanical device, NuVasive MODEx titanium expandable interbody, expanded to anterior height of 13.3, posterior height 11.2 with 6 degrees of lordosis Intraoperative use of autograft, same incision Intraoperative use of allograft  Intraoperative use of BMP, xxs Intraoperative use of fluoroscopy, greater than 1 hour Intraoperative use of microscope, for microdissection Removal of spinal cord stimulator and IPG   SURGEON: Dr. [name], DO   ASSISTANT: Dr. Autumn Patty, MD   ANESTHESIA: General Endotracheal   EBL: 200 cc   SPECIMENS: None   DRAINS: None   COMPLICATIONS: None   CONDITION: Hemodynamically stable   HISTORY: Eric Casey is a 46 y.o. y.o. male who initially presented to the outpatient clinic with signs and symptoms consistent with right lower extremity radiculopathy.CT myelogram revealed adjacent segment disc herniation at L3-4 eccentric to the right with lateral recess and foraminal stenosis.  He failed conservative measures.  Therefore offered him surgical intervention in the form of a L3-4 minimally invasive decompression, transforaminal lumbar body fusion, exploration of fusion L4-5 and removal of spinal cord stimulator (as he felt this was no longer benefiting him).  All risks, benefits and expected  outcomes were discussed agreed upon.   PROCEDURE IN DETAIL: The patient was brought to the operating room. After induction of general anesthesia, the patient was positioned on the operative table in the prone position on the Laurie table. All pressure points were meticulously padded. Skin incision was then marked out and prepped and draped in the usual sterile fashion. Physician driven timeout was performed.  Local anesthetic was injected into the planned incisions.  Using a 10 blade, incision was made over the prior anchors at the dorsal column stimulator.  Using Bovie electrocautery these were freed from scar tissue and gently removed without difficulty.  These were cut.  Using a 10 blade I then opened the IPG prior incision sharply.  Using Bovie electrocautery to expose the IPG and removed it from its pocket.  The remainder of the wires and IPG was removed without difficulty.  Using biplanar fluoroscopy, the C arm for sterilely draped and brought into the field and the paramedian incisions were planned over the L3-4, L4-5 interspace.  Local anesthetic was injected bilaterally.  Using a 10 blade, paramedian incision was created bilaterally.  Using Jamshidi, the bilateral L3 pedicles were accessed under biplanar fluoroscopy.  K wires were placed and the Jamshidi's were removed.  Using Bovie electrocautery, paramedian Wiltsie approach was taken to the prior hardware at L4-5 bilaterally.  These were exposed, setscrews were removed and rods were removed, pedicle screws were removed.  K wires were placed in the prior pedicle screw trajectories..  Attention was then turned to docking the Metrix dilator.  Using with a series of dilators, on the patient's right side the facet lamina junction was docked with a 22 x 8 mm tube.  This was confirmed to be in the appropriate trajectory under lateral fluoroscopy.  At this point the  microscope was sterilely draped and brought into the field.   Using Bovie  electrocautery, soft tissue was cleared from the lamina and facet at L3-4.  Using a high-speed drill and collecting the autograft, a laminectomy and complete facetectomy was performed down to the ligamentum flavum.  This autograft was saved for later.  The ligamentum flavum was identified to its attachment along the superior lamina and lateral pars.  Using a series of micro curettes, the ligamentum flavum was gently elevated and the epidural space was identified.  The ligamentum flavum was completely resected using Kerrison rongeurs.  The traversing and exiting nerve roots were identified.  Using Kerrison rongeurs, the common dural tube and traversing and exiting nerve roots were completely decompressed from the ligamentum flavum.  They appeared pulsatile and noncompressed.  Camden's triangle was identified, the traversing nerve root was gently retracted medially, and the epidural space was coagulated and cleared of epidural veins.  The disc space was identified.  The disc annulus was then coagulated and incised with an 11 blade.  Using Kerrison rongeurs the annulotomy was widened.  Using a series of disc prep shavers and curettes a radical discectomy was performed to subchondral bleeding bone at both endplates.  The disc base was then trialed and found to be in 9 mm interbody size.  This was then expanded anteriorly and posteriorly to the above listed height with good endplate contact under lateral fluoroscopy.  A mixture of autograft and allograft was then placed anteriorly with BMP and medially and packed with a bone tamp.  Using a nerve root retractor, the traversing nerve root was gently protected during placement of the interbody.   Using lateral fluoroscopy, the interbody was then placed under live fluoroscopy.  Appropriate placement was identified.  The remainder of the autograft and allograft was then placed laterally and the intervertebral space to the interbody device.  Hemostasis was achieved with  passive hemostatics and bipolar cautery.  The traversing nerve root, neural tube and exiting nerve root were followed with a ball-tipped probe and noted to be noncompressed, pulsatile and in their normal position.  A few pieces of Gelfoam with thrombin were placed over the thecal sac.  The tube was moved laterally and remaining autograft and allograft was place in the lateral gutters. The Metrix dilator tube was gently removed and hemostasis was achieved with bipolar cautery and the soft tissues.   The above listed pedicle screws were then placed under lateral fluoroscopy over the K wires bilaterally at L3, L4, L5.  Appropriate sized rods were measured, contoured and placed percutaneously.  These were confirmed to be within the tulips, setscrews were placed and final tightened to the manufacturer's recommendations.  Percutaneous towers were removed from the screws.  Final AP and lateral fluoroscopy images confirmed appropriate placement of all hardware.  Hemostasis was achieved with bipolar cautery.  The wounds were closed in layers, 0 Vicryl's for fascia, 2-0 and 3-0 Vicryl sutures for the dermis.  Skin was closed with skin glue.  Sterile dressing was applied.   At the end of the case all sponge, needle, and instrument counts were correct. The patient was then transferred to the stretcher, extubated, and taken to the post-anesthesia care unit in stable hemodynamic condition.

## 2021-12-10 NOTE — Anesthesia Procedure Notes (Signed)
Procedure Name: Intubation Date/Time: 12/10/2021 11:16 AM  Performed by: Darral Dash, DOPre-anesthesia Checklist: Patient identified, Emergency Drugs available, Suction available and Patient being monitored Patient Re-evaluated:Patient Re-evaluated prior to induction Oxygen Delivery Method: Circle system utilized Preoxygenation: Pre-oxygenation with 100% oxygen Induction Type: IV induction Ventilation: Mask ventilation without difficulty and Oral airway inserted - appropriate to patient size Laryngoscope Size: Mac and 4 Grade View: Grade I Tube type: Oral Tube size: 7.5 mm Number of attempts: 1 Airway Equipment and Method: Stylet Placement Confirmation: ETT inserted through vocal cords under direct vision, positive ETCO2 and breath sounds checked- equal and bilateral Secured at: 22 cm Tube secured with: Tape Dental Injury: Teeth and Oropharynx as per pre-operative assessment

## 2021-12-10 NOTE — Transfer of Care (Signed)
Immediate Anesthesia Transfer of Care Note  Patient: Eric Casey  Procedure(s) Performed: MIS DECOMP,TLIF L34,EXPLORE FUSIONL45 (Right) LUMBAR SPINAL CORD STIMULATOR REMOVAL  Patient Location: PACU  Anesthesia Type:General  Level of Consciousness: drowsy  Airway & Oxygen Therapy: Patient Spontanous Breathing and Patient connected to nasal cannula oxygen  Post-op Assessment: Report given to RN, Post -op Vital signs reviewed and stable, and Patient moving all extremities X 4  Post vital signs: Reviewed and stable  Last Vitals:  Vitals Value Taken Time  BP 125/66 12/10/21 1546  Temp 36.3 C 12/10/21 1545  Pulse 93 12/10/21 1556  Resp 10 12/10/21 1556  SpO2 97 % 12/10/21 1556  Vitals shown include unvalidated device data.  Last Pain:  Vitals:   12/10/21 1545  TempSrc:   PainSc: 10-Worst pain ever      Patients Stated Pain Goal: 3 (12/10/21 1029)  Complications: No notable events documented.

## 2021-12-10 NOTE — H&P (Signed)
Providing Compassionate, Quality Care - Together  NEUROSURGERY HISTORY & PHYSICAL   Eric Casey is an 46 y.o. male.   Chief Complaint: RLE radiculopathy HPI: This is a 46 year old male with a history of an L4-5 fusion, with progressive worsening low back pain and right lower extremity radiculopathy.  Imaging revealed severe stenosis and degenerative spondylosis at L3-4.  He failed conservative measures and therefore presents today for surgical intervention.  He also has a spinal cord stimulator that he believes is not providing him any benefit and he would like to have removed.  Past Medical History:  Diagnosis Date   Arthritis    Back pain, chronic    Buttock pain    Complication of anesthesia    High blood pressure    PONV (postoperative nausea and vomiting)     Past Surgical History:  Procedure Laterality Date   ANTERIOR CERVICAL DECOMP/DISCECTOMY FUSION N/A 05/04/2021   Procedure: Anterior Cervical Decompression Discectomy Fusion Cervical six-seven;  Surgeon: Tricia Pledger, Alan Mulder, DO;  Location: [facility] OR;  Service: Neurosurgery;  Laterality: N/A;   MICRODISCECTOMY LUMBAR     PILONIDAL CYST EXCISION     SPINAL CORD STIMULATOR INSERTION N/A 11/18/2016   Procedure: LUMBAR SPINAL CORD STIMULATOR INSERTION;  Surgeon: Odette Fraction, MD;  Location: [facility] OR;  Service: Neurosurgery;  Laterality: N/A;   TRANSFORAMINAL LUMBAR INTERBODY FUSION (TLIF) WITH PEDICLE SCREW FIXATION 1 LEVEL Left 10/18/2019   Procedure: Left Lumbar Four-Five Transforaminal lumbar interbody fusion;  Surgeon: Maeola Harman, MD;  Location: [facility] OR;  Service: Neurosurgery;  Laterality: Left;  posterior    Family History  Problem Relation Age of Onset   Heart disease Father    Hypertension Father    Social History:  reports that he quit smoking about 15 years ago. His smoking use included cigarettes. He has never used smokeless tobacco. He reports that he does not drink alcohol and does not use drugs.  Allergies:   Allergies  Allergen Reactions   Codeine Hives   Cymbalta [Duloxetine Hcl] Other (See Comments)    "Felt strange"   Flexeril [Cyclobenzaprine] Other (See Comments)    sedation   Robaxin [Methocarbamol] Nausea Only   Zanaflex [Tizanidine Hcl] Nausea Only    Medications Prior to Admission  Medication Sig Dispense Refill   atenolol (TENORMIN) 25 MG tablet Take 25 mg by mouth daily.     atorvastatin (LIPITOR) 10 MG tablet Take 10 mg by mouth daily.     diclofenac (VOLTAREN) 75 MG EC tablet Take 75 mg by mouth 2 (two) times daily.     gabapentin (NEURONTIN) 300 MG capsule Take 300 mg by mouth 3 (three) times daily.     loratadine (CLARITIN) 10 MG tablet Take 10 mg by mouth daily.     losartan (COZAAR) 100 MG tablet Take 100 mg daily by mouth.     tapentadol (NUCYNTA) 50 MG TABS tablet Take 1 tablet (50 mg total) by mouth every 4 (four) hours as needed for severe pain. 150 tablet 0   tiZANidine (ZANAFLEX) 4 MG tablet Take 4-8 mg by mouth 3 (three) times daily as needed for muscle spasms.     fluticasone (FLONASE) 50 MCG/ACT nasal spray Place 2 sprays into both nostrils daily as needed for allergies or rhinitis.      No results found for this or any previous visit (from the past 48 hour(s)). No results found.  ROS All pertinent positives and negatives listed HPI above Blood pressure 135/88, pulse 72, temperature 98.9 F (  37.2 C), temperature source Oral, resp. rate 18, height 5\' 10"  (1.778 m), weight 111.9 kg, SpO2 96 %. Physical Exam  Awake alert oriented x 3, no acute distress PERRLA Cranial nerves II through XII intact Bilateral upper extremities full strength Bilateral lower extremities full strength Decreased sensation in the right L3-L4 distribution lower extremity  Assessment/Plan 46 year old male with  L3-4 adjacent segment spondylosis, stenosis with right lower extremity radiculopathy Failure of spinal cord stimulator   -Or today for right L3-4 decompression,  minimally invasive transforaminal lumbar interbody fusion, removal of spinal cord stimulator and exploration of fusion L4-5.  All risks benefits and expected outcomes were discussed and agreed upon.  Informed consent was obtained and witnessed.  Thank you for allowing me to participate in this patient's care.  Please do not hesitate to call with questions or concerns.   50, DO Neurosurgeon [facility] Cell: [phone]

## 2021-12-10 NOTE — Progress Notes (Signed)
Patient complained of pain of 8. Quentin Ore, CRNA at bedside treated with of fentanyl.

## 2021-12-11 DIAGNOSIS — M4726 Other spondylosis with radiculopathy, lumbar region: Secondary | ICD-10-CM | POA: Diagnosis not present

## 2021-12-11 MED ORDER — OXYCODONE-ACETAMINOPHEN 5-325 MG PO TABS: 1.0000 | ORAL_TABLET | ORAL | 0 refills | Status: AC | PRN

## 2021-12-11 MED ORDER — TAPENTADOL HCL 50 MG PO TABS: 100.0000 mg | ORAL_TABLET | ORAL | 0 refills | Status: AC

## 2021-12-11 NOTE — Discharge Summary (Signed)
Discharge Summary  Date of Admission: 12/10/2021  Date of Discharge: 12/11/21  Attending Physician: Monia Pouch, MD  Hospital Course: Patient was admitted following an uncomplicated L3-4 TLIF, removal of SCS. They were recovered in PACU and transferred to 6N. Their preop symptoms were improved, their hospital course was uncomplicated and the patient was discharged home. They will follow up in clinic with me in clinic in 2 weeks.  Neurologic exam at discharge:  Strength 5/5 x4 and SILTx4 except b/l LFCN numbness  Discharge diagnosis: Adjacent segment disease, lumbar radiculopathy  Jadene Pierini, MD 12/11/21 10:11 AM

## 2021-12-11 NOTE — Progress Notes (Signed)
Neurosurgery Service Progress Note  Subjective: No acute events overnight, some b/l LFCN numbness - had this post-op last time he was prone as well, no new symptoms / complaints  Objective: Vitals:   12/11/21 0418 12/11/21 0734 12/11/21 0737 12/11/21 0839  BP: 98/65 119/78 119/72 99/65  Pulse: 92 94 92 82  Resp: 18   18  Temp: 98.3 F (36.8 C) 99.9 F (37.7 C) 99.5 F (37.5 C) 98.1 F (36.7 C)  TempSrc: Oral Oral Oral Oral  SpO2: 98% 99% 99% 97%  Weight:      Height:        Physical Exam: Strength 5/5 x4 and SILTx4   Assessment & Plan: 46 y.o. man s/p L3-4 TLIF, removal of SCS, recovering well.  -discharge home today  Jadene Pierini  12/11/21 10:10 AM

## 2021-12-11 NOTE — Progress Notes (Signed)
OT Cancellation Note  Patient Details Name: Eric Casey MRN: [id number] DOB: May 19, 1975   Cancelled Treatment:    Reason Eval/Treat Not Completed: OT screened, no needs identified, will sign off Patient screened by OT per PT. Patient is independent in mobility, with previous history of back surgeries so is well versed in education and precautions. Please re-consult if further acute needs arise.   Eric Casey, OTR/L Acute Rehabilitation Services [phone]   Cherlyn Cushing 12/11/2021, 9:59 AM

## 2021-12-11 NOTE — Progress Notes (Signed)
PT Cancellation Note  Patient Details Name: Eric Casey MRN: [id number] DOB: 26-Jun-1975   Cancelled Treatment:    Reason Eval/Treat Not Completed: PT screened, no needs identified, will sign off. Pt is well known to this PT from prior admissions. Observed him ambulating independently in the hallway. Reviewed precautions with pt and wife, and pt reports no concerns with ability to mobilize around his home or negotiate stairs. Precaution sheet issued for reference, but pt does not require a formal PT evaluation at this time. If needs change, please reconsult.    Marylynn Pearson 12/11/2021, 9:38 AM  Conni Slipper, PT, DPT Acute Rehabilitation Services Secure Chat Preferred Office: [phone]

## 2021-12-11 NOTE — TOC Transition Note (Signed)
Transition of Care [facility]) - CM/SW Discharge Note   Patient Details  Name: Eric Casey MRN: [id number] Date of Birth: 11/23/1975  [facility] CM/SW Contact:  Bess Kinds, RN Phone Number: [phone] 12/11/2021, 10:20 AM   Clinical Narrative:     Patient to transition home today. Chart reviewed. No TOC needs identified at this time.   Final next level of care: Home/Self Care Barriers to Discharge: No Barriers Identified   Patient Goals and CMS Choice        Discharge Placement                       Discharge Plan and Services                                     Social Determinants of Health (SDOH) Interventions     Readmission Risk Interventions     No data to display

## 2021-12-13 ENCOUNTER — Encounter (HOSPITAL_COMMUNITY): Payer: Self-pay | Admitting: Neurological Surgery

## 2021-12-22 ENCOUNTER — Encounter (HOSPITAL_COMMUNITY): Payer: Self-pay | Admitting: Neurological Surgery

## 2023-08-28 IMAGING — XA DG MYELOGRAPHY LUMBAR INJ CERVICAL
8 series · 8 of 8 positions shown · non-contrast
Comparison: None.

CLINICAL DATA: Left-sided neck and diffuse arm pain extending into
the hand for the past 3 months. No injury or prior surgery.
TECHNIQUE: Contiguous axial images were obtained through the cervical spine
after the intrathecal infusion of contrast. Coronal and sagittal
reconstructions were obtained of the axial image sets.

[Series 1: vasc standard · 1 of 1 slices shown (1 of 8)]
[im 1/1]
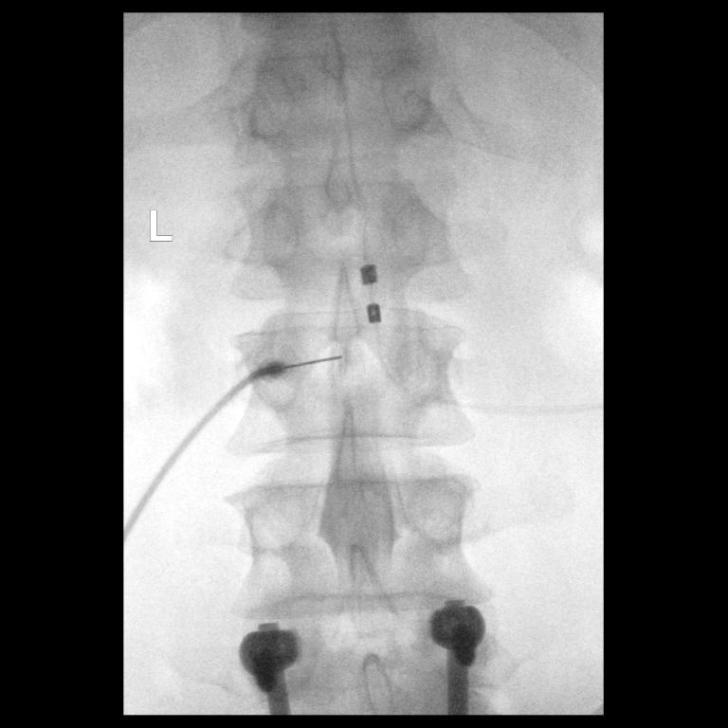

[Series 2: vasc standard · 1 of 1 slices shown (2 of 8)]
[im 1/1]
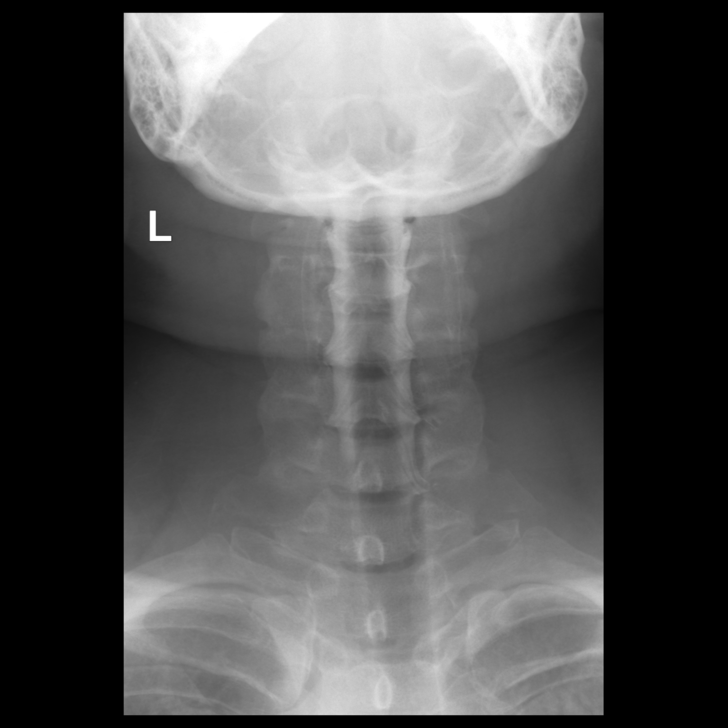

[Series 3: vasc standard · 1 of 1 slices shown (3 of 8)]
[im 1/1]
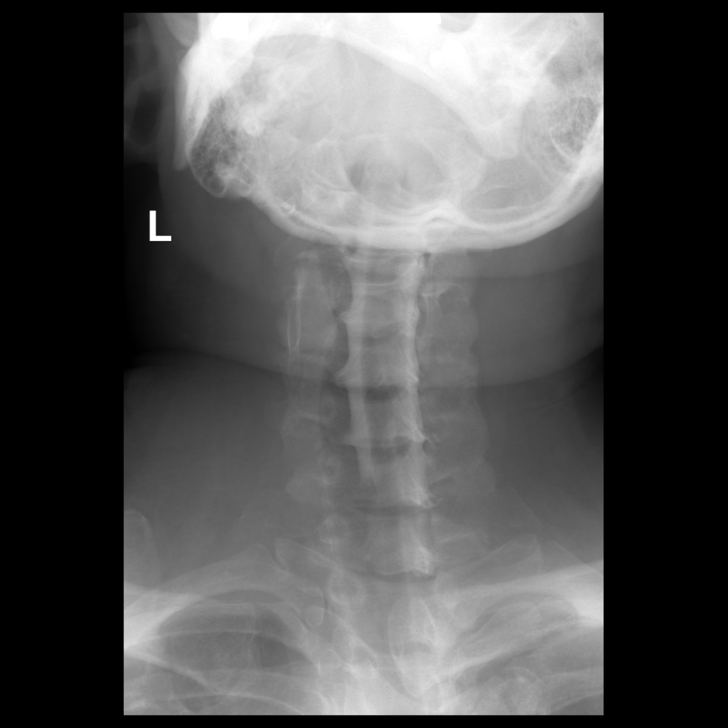

[Series 4: vasc standard · 1 of 1 slices shown (4 of 8)]
[im 1/1]
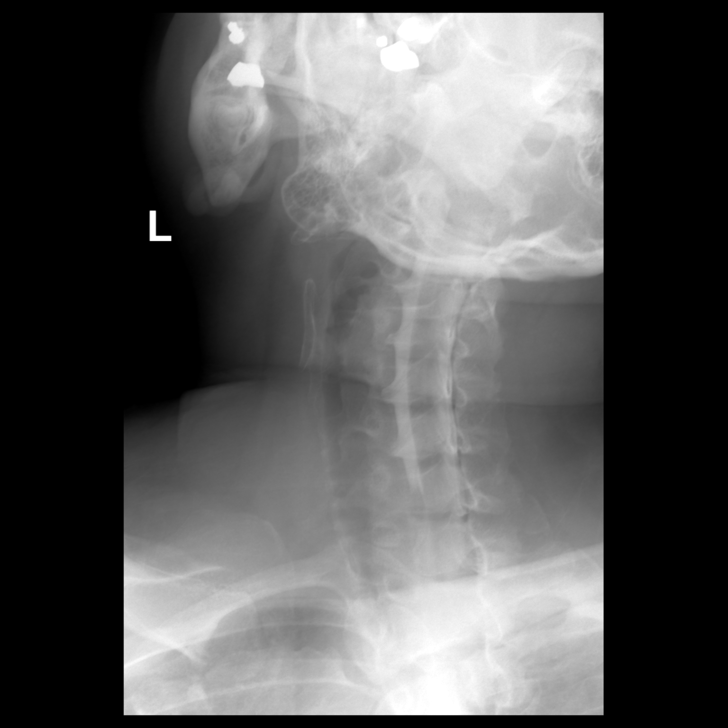

[Series 5: vasc standard · 1 of 1 slices shown (5 of 8)]
[im 1/1]
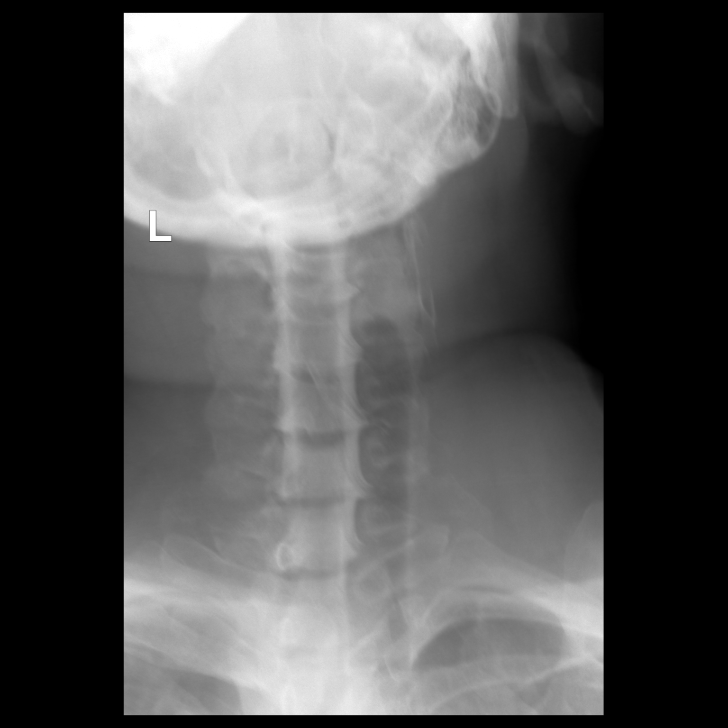

[Series 6: vasc standard · 1 of 1 slices shown (6 of 8)]
[im 1/1]
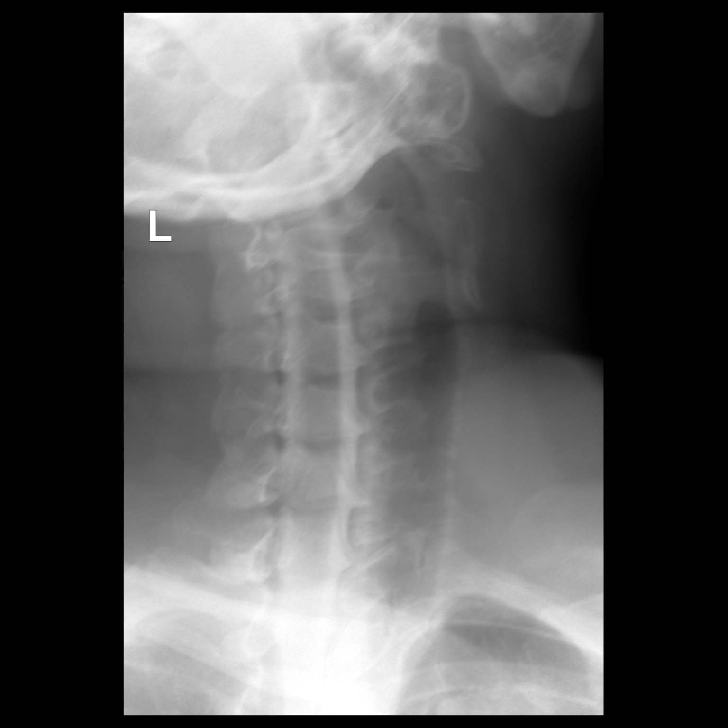

[Series 7: vasc standard · 1 of 1 slices shown (7 of 8)]
[im 1/1]
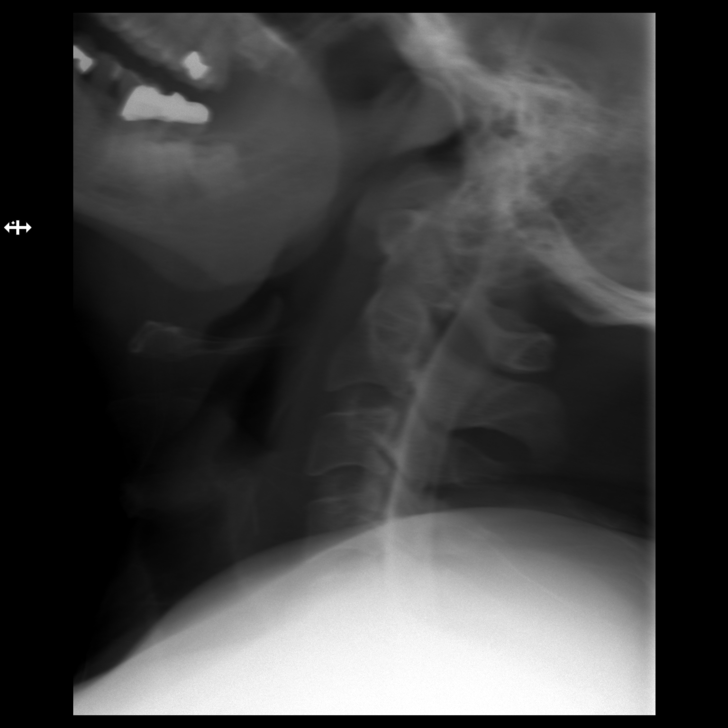

[Series 8: vasc standard · 1 of 1 slices shown (8 of 8)]
[im 1/1]
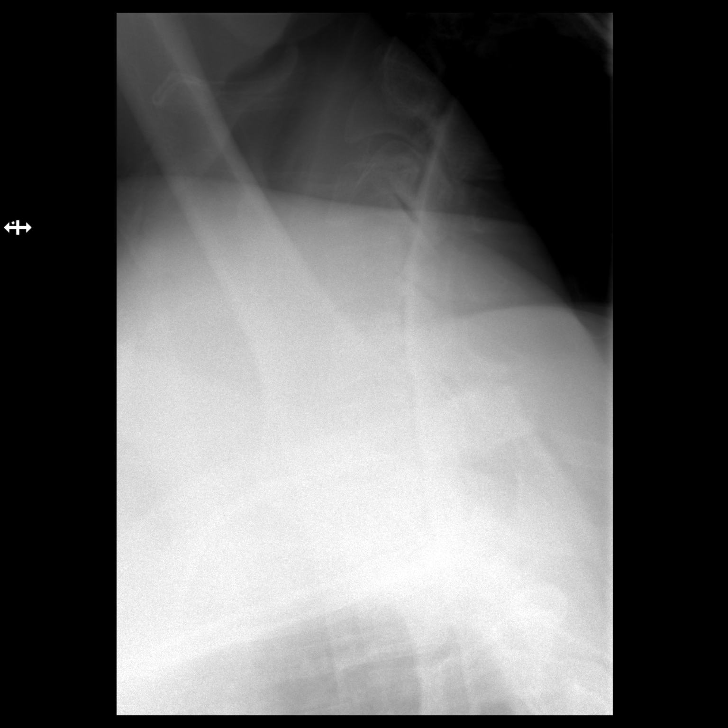

[8 of 8 positions shown; findings below may reference images not displayed]

EXAM:
CERVICAL MYELOGRAM

CT CERVICAL MYELOGRAM

FLUOROSCOPY:
Radiation Exposure Index (as provided by the fluoroscopic device):
9.5 mGy Kerma

PROCEDURE:
LUMBAR PUNCTURE FOR CERVICAL MYELOGRAM

After thorough discussion of risks and benefits of the procedure
including bleeding, infection, injury to nerves, blood vessels,
adjacent structures as well as headache and CSF leak, written and
oral informed consent was obtained. Consent was obtained by Dr.
Tamra Wadley. We discussed the high likelihood of obtaining a
diagnostic study.

Patient was positioned prone on the fluoroscopy table. Local
anesthesia was provided with 1% lidocaine without epinephrine after
prepped and draped in the usual sterile fashion. Puncture was
performed at L1-L2 using a 3 1/2 inch 22-gauge spinal needle via
left interlaminar approach. Using a single pass through the dura,
the needle was placed within the thecal sac, with return of clear
CSF. 10 mL of Isovue Q-PPP was injected into the thecal sac, with
normal opacification of the nerve roots and cauda equina consistent
with free flow within the subarachnoid space. The patient was then
moved to the trendelenburg position and contrast flowed into the
cervical spine region.

I personally performed the lumbar puncture and administered the
intrathecal contrast. I also personally supervised acquisition of
the myelogram images.
FINDINGS: CERVICAL MYELOGRAM FINDINGS:

Normal alignment. Small ventral extradural defect at C6-C7.
Asymmetric underfilling and effacement of the left C7 nerve root. No
high-grade spinal canal stenosis.

CT CERVICAL MYELOGRAM FINDINGS:

Alignment: Straightening of the normal cervical lordosis. No
listhesis.

Skull base and vertebrae: No acute fracture or other focal
pathologic process. C6 hemangioma.

Cord: Normal bulk and morphology.

Soft tissues and upper chest: Negative.

Disc levels:

C2-C3:  Tiny shallow left paracentral disc protrusion.  No stenosis.

C3-C4:  Negative disc.  Mild left facet arthropathy.  No stenosis.

C4-C5:  Tiny shallow left paracentral disc protrusion.  No stenosis.

C5-C6:  Small shallow central disc protrusion.  No stenosis.

C6-C7: Large left foraminal disc protrusion and mild left
uncovertebral hypertrophy. Severe left neuroforaminal stenosis with
impingement of the exiting left C7 nerve root. No spinal canal or
right neuroforaminal stenosis.

C7-T1: Tiny central disc protrusion. Mild bilateral facet
arthropathy. No stenosis.
IMPRESSION: 1. Large left foraminal disc protrusion at C6-C7 with severe left
neuroforaminal stenosis and impingement of the exiting left C7 nerve
root.

## 2023-09-24 IMAGING — RF DG CERVICAL SPINE 2 OR 3 VIEWS
1 series · 4 of 4 positions shown · non-contrast
Comparison: CT cervical spine 04/07/2021

CLINICAL DATA: ACDF C6-7

EXAM:
CERVICAL SPINE - 2-3 VIEW

[Series 1: run · 4 of 4 slices shown]
[im 1/4]
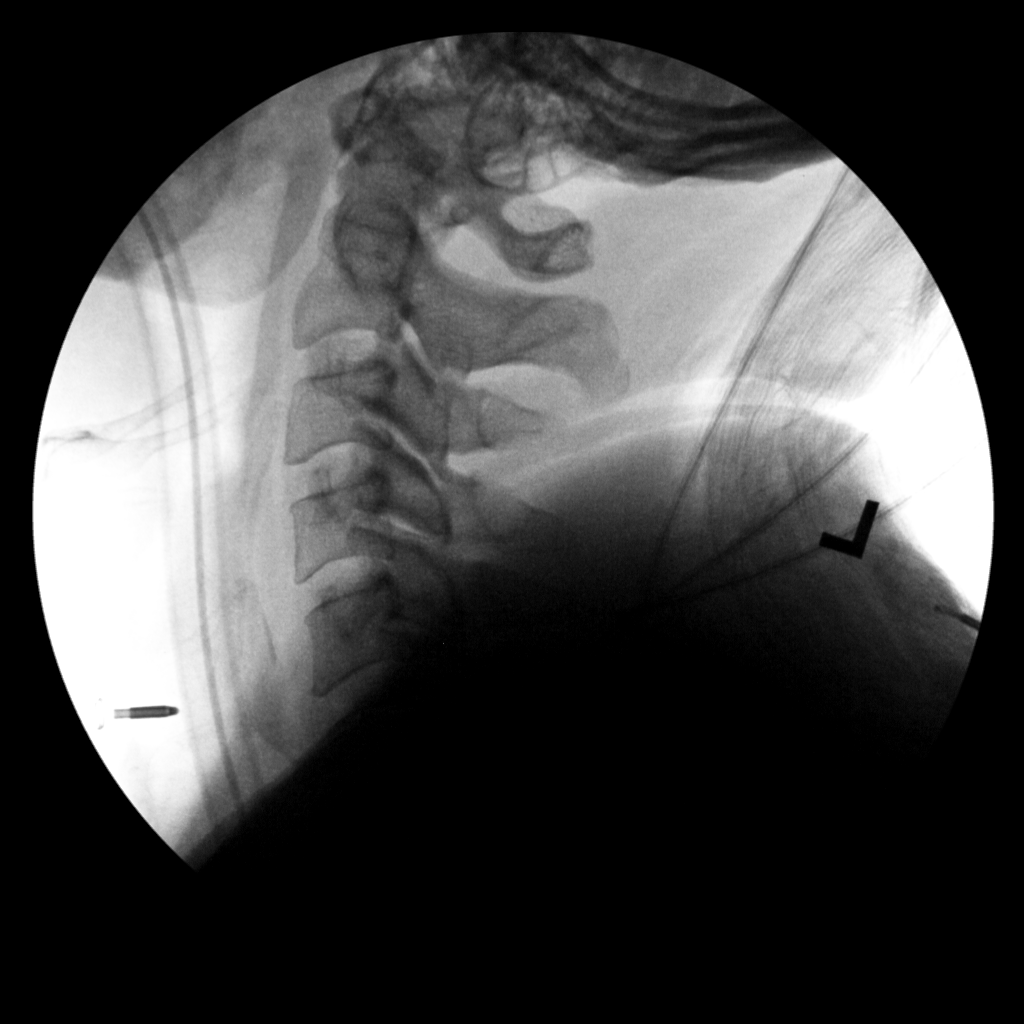
[im 2/4]
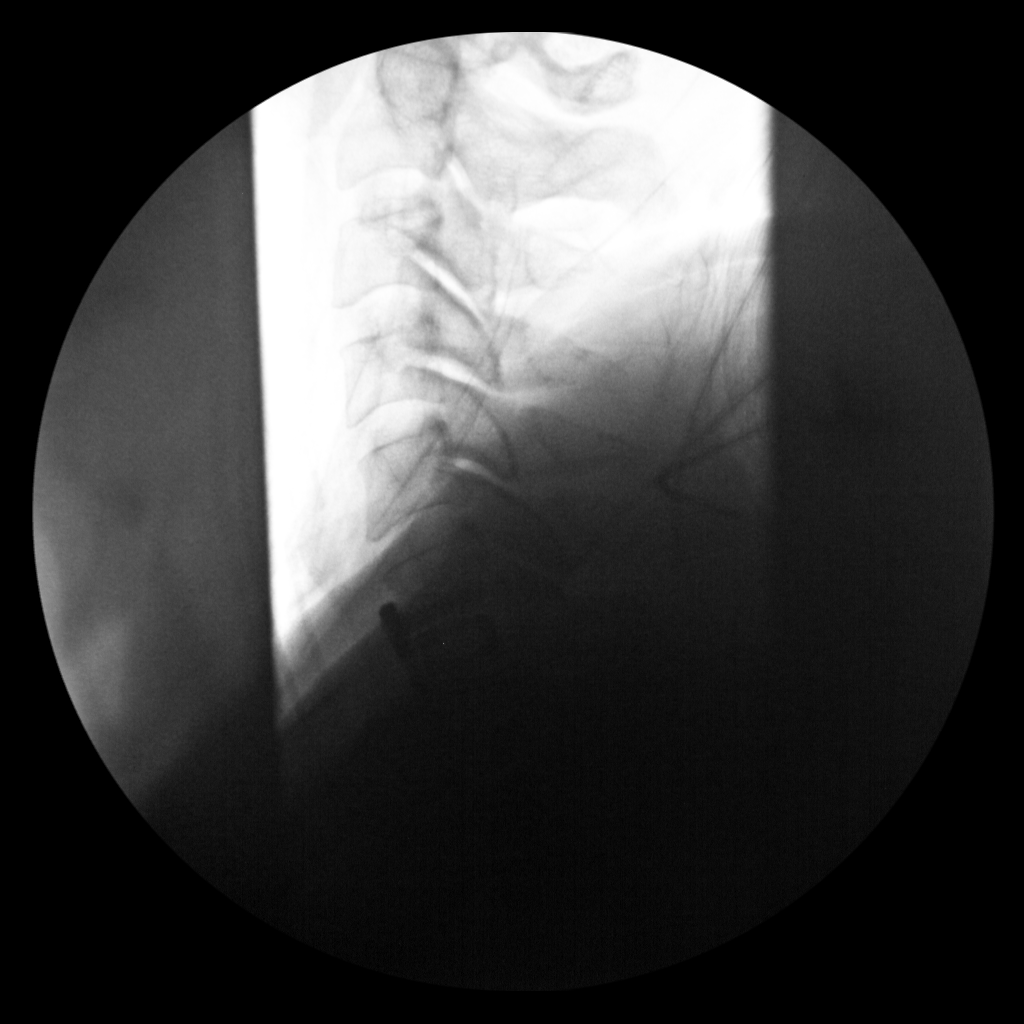
[im 3/4]
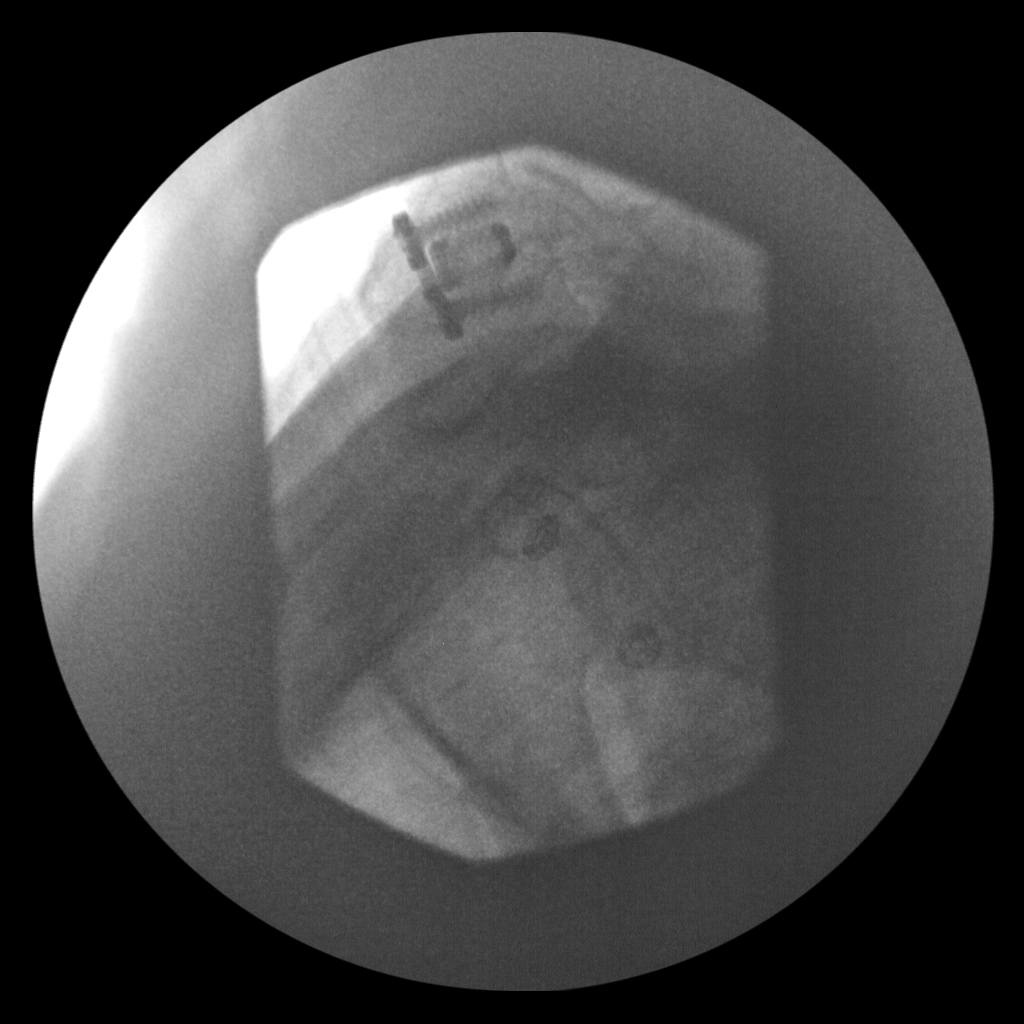
[im 4/4]
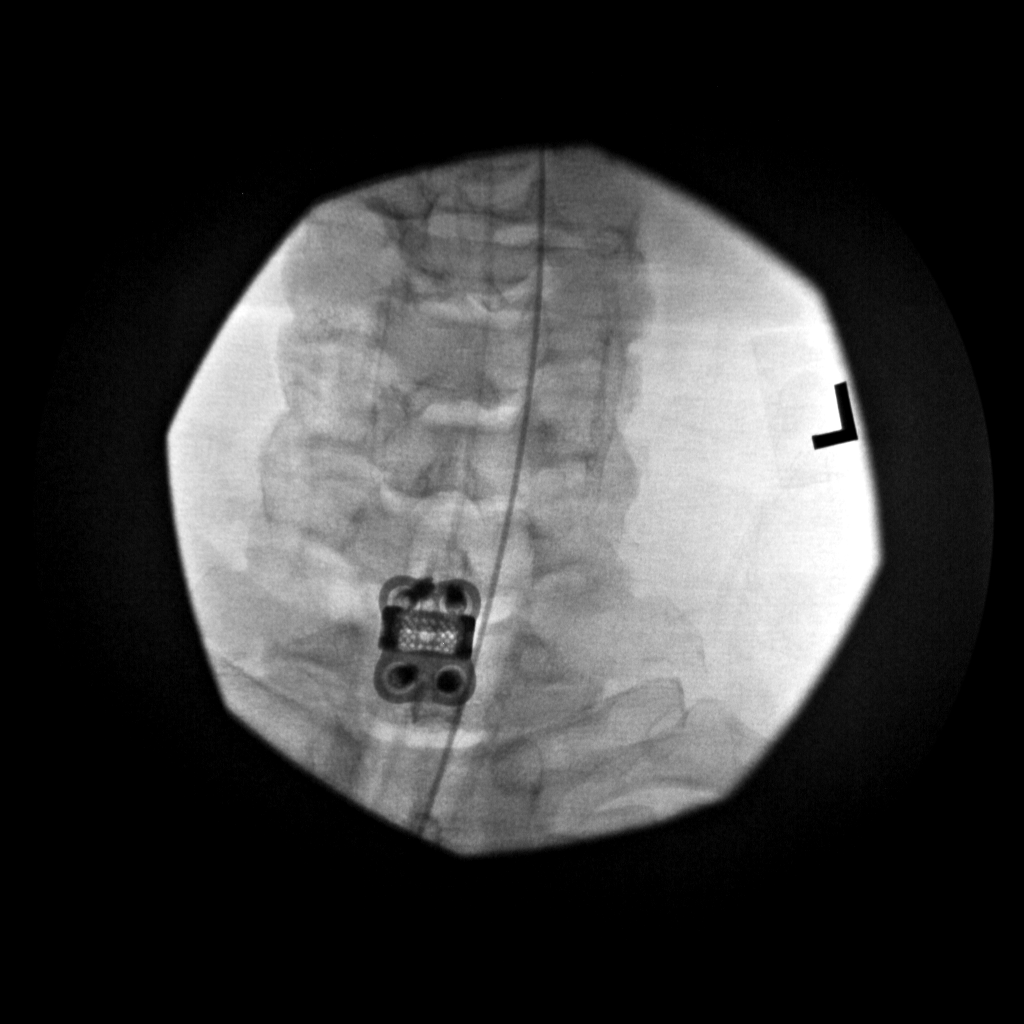

[4 of 4 positions shown; findings below may reference images not displayed]

FINDINGS: Seven C-arm images of the cervical spine were obtained. There has
been ACDF at C6-7. Anterior plate and screws in good position.
Interbody spacer in good position.
IMPRESSION: ACDF C6-7.

## 2024-02-06 ENCOUNTER — Other Ambulatory Visit: Payer: Self-pay | Admitting: Orthopedic Surgery

## 2024-02-06 DIAGNOSIS — Z8739 Personal history of other diseases of the musculoskeletal system and connective tissue: Secondary | ICD-10-CM

## 2024-02-06 DIAGNOSIS — M542 Cervicalgia: Secondary | ICD-10-CM

## 2024-03-05 ENCOUNTER — Other Ambulatory Visit

## 2024-03-11 ENCOUNTER — Other Ambulatory Visit
